# Patient Record
Sex: Female | Born: 1975 | Race: White | Hispanic: No
Health system: Midwestern US, Community
[De-identification: ages and names within clinical notes are randomized; demographics above are authoritative.]

## PROBLEM LIST (undated history)

## (undated) DIAGNOSIS — Z9049 Acquired absence of other specified parts of digestive tract: Secondary | ICD-10-CM

## (undated) DIAGNOSIS — J189 Pneumonia, unspecified organism: Secondary | ICD-10-CM

## (undated) DIAGNOSIS — N809 Endometriosis, unspecified: Secondary | ICD-10-CM

## (undated) DIAGNOSIS — M545 Low back pain: Secondary | ICD-10-CM

## (undated) DIAGNOSIS — Z9889 Other specified postprocedural states: Secondary | ICD-10-CM

## (undated) DIAGNOSIS — R51 Headache: Secondary | ICD-10-CM

## (undated) DIAGNOSIS — Z8719 Personal history of other diseases of the digestive system: Secondary | ICD-10-CM

## (undated) DIAGNOSIS — R112 Nausea with vomiting, unspecified: Secondary | ICD-10-CM

## (undated) DIAGNOSIS — E282 Polycystic ovarian syndrome: Secondary | ICD-10-CM

## (undated) DIAGNOSIS — F908 Attention-deficit hyperactivity disorder, other type: Secondary | ICD-10-CM

## (undated) DIAGNOSIS — E663 Overweight: Secondary | ICD-10-CM

## (undated) DIAGNOSIS — R634 Abnormal weight loss: Secondary | ICD-10-CM

## (undated) DIAGNOSIS — Z20822 Contact with and (suspected) exposure to covid-19: Secondary | ICD-10-CM

## (undated) DIAGNOSIS — R0789 Other chest pain: Secondary | ICD-10-CM

## (undated) HISTORY — PX: CHOLECYSTECTOMY: SHX55

## (undated) HISTORY — DX: Polycystic ovarian syndrome: E28.2

## (undated) HISTORY — DX: Low back pain: M54.5

## (undated) HISTORY — PX: WISDOM TOOTH EXTRACTION: SHX21

## (undated) HISTORY — DX: Acquired absence of other specified parts of digestive tract: Z90.49

## (undated) HISTORY — PX: OOPHORECTOMY: SHX86

---

## 2001-04-09 ENCOUNTER — Other Ambulatory Visit: Admission: RE | Admit: 2001-04-09 | Discharge: 2001-04-09 | Payer: Self-pay | Admitting: Obstetrics and Gynecology

## 2001-09-23 DIAGNOSIS — E282 Polycystic ovarian syndrome: Secondary | ICD-10-CM | POA: Insufficient documentation

## 2002-01-30 ENCOUNTER — Other Ambulatory Visit: Admission: RE | Admit: 2002-01-30 | Discharge: 2002-01-30 | Payer: Self-pay | Admitting: *Deleted

## 2002-05-08 DIAGNOSIS — Z9049 Acquired absence of other specified parts of digestive tract: Secondary | ICD-10-CM

## 2002-05-08 HISTORY — DX: Acquired absence of other specified parts of digestive tract: Z90.49

## 2002-08-26 ENCOUNTER — Inpatient Hospital Stay (HOSPITAL_COMMUNITY): Admission: AD | Admit: 2002-08-26 | Discharge: 2002-08-28 | Payer: Self-pay | Admitting: *Deleted

## 2002-10-02 ENCOUNTER — Other Ambulatory Visit: Admission: RE | Admit: 2002-10-02 | Discharge: 2002-10-02 | Payer: Self-pay | Admitting: *Deleted

## 2002-10-03 ENCOUNTER — Encounter: Payer: Self-pay | Admitting: *Deleted

## 2002-10-03 ENCOUNTER — Encounter: Admission: RE | Admit: 2002-10-03 | Discharge: 2002-10-03 | Payer: Self-pay | Admitting: *Deleted

## 2002-10-16 ENCOUNTER — Encounter: Payer: Self-pay | Admitting: Surgery

## 2002-10-16 ENCOUNTER — Observation Stay (HOSPITAL_COMMUNITY): Admission: RE | Admit: 2002-10-16 | Discharge: 2002-10-16 | Payer: Self-pay | Admitting: Surgery

## 2003-04-15 ENCOUNTER — Encounter: Admission: RE | Admit: 2003-04-15 | Discharge: 2003-04-15 | Payer: Self-pay | Admitting: *Deleted

## 2003-09-21 ENCOUNTER — Other Ambulatory Visit: Admission: RE | Admit: 2003-09-21 | Discharge: 2003-09-21 | Payer: Self-pay | Admitting: *Deleted

## 2012-01-12 ENCOUNTER — Other Ambulatory Visit: Payer: Self-pay | Admitting: Obstetrics and Gynecology

## 2012-01-12 ENCOUNTER — Ambulatory Visit (HOSPITAL_COMMUNITY)
Admission: AD | Admit: 2012-01-12 | Discharge: 2012-01-12 | Disposition: A | Discharge status: Home / Self Care | Payer: Managed Care, Other (non HMO) | Source: Ambulatory Visit | Attending: Obstetrics and Gynecology | Admitting: Obstetrics and Gynecology

## 2012-01-12 ENCOUNTER — Ambulatory Visit (INDEPENDENT_AMBULATORY_CARE_PROVIDER_SITE_OTHER): Payer: Self-pay

## 2012-01-12 ENCOUNTER — Encounter: Payer: Self-pay | Admitting: Obstetrics and Gynecology

## 2012-01-12 ENCOUNTER — Ambulatory Visit (INDEPENDENT_AMBULATORY_CARE_PROVIDER_SITE_OTHER): Payer: Managed Care, Other (non HMO) | Admitting: Obstetrics and Gynecology

## 2012-01-12 ENCOUNTER — Telehealth: Payer: Self-pay | Admitting: Obstetrics and Gynecology

## 2012-01-12 VITALS — BP 118/70 | Temp 98.9°F | Ht 66.0 in | Wt 180.0 lb

## 2012-01-12 DIAGNOSIS — O209 Hemorrhage in early pregnancy, unspecified: Secondary | ICD-10-CM

## 2012-01-12 LAB — CBC
HCT: 40.4 % (ref 36.0–46.0)
Hemoglobin: 14.3 g/dL (ref 12.0–15.0)
MCH: 32.4 pg (ref 26.0–34.0)
MCHC: 35.4 g/dL (ref 30.0–36.0)
MCV: 91.4 fL (ref 78.0–100.0)
Platelets: 356 10*3/uL (ref 150–400)
RBC: 4.42 MIL/uL (ref 3.87–5.11)
RDW: 13.1 % (ref 11.5–15.5)
WBC: 12.2 10*3/uL — ABNORMAL HIGH (ref 4.0–10.5)

## 2012-01-12 LAB — POCT URINALYSIS DIPSTICK
Bilirubin, UA: NEGATIVE
Blood, UA: NEGATIVE
Glucose, UA: NEGATIVE
Leukocytes, UA: NEGATIVE
Nitrite, UA: NEGATIVE
Protein, UA: NEGATIVE
Spec Grav, UA: <=1.005
Urobilinogen, UA: NEGATIVE
pH, UA: 5

## 2012-01-12 LAB — RH TYPE: Rh Type: POSITIVE

## 2012-01-12 LAB — ABO/RH: ABO/RH(D): O POS

## 2012-01-12 LAB — POCT URINE PREGNANCY: Preg Test, Ur: POSITIVE

## 2012-01-12 LAB — ABO

## 2012-01-12 NOTE — Telephone Encounter (Signed)
Triage/ob problem

## 2012-01-12 NOTE — Telephone Encounter (Signed)
Pt called, states is about 7 weeks, for past 2 mornings has had bright red spotting seen with wiping.  Pt also c/o RLQ pain that feels "like something is tearing".  Pt denies any recent IC and is unsure of blood type but this is her 3rd pregnancy.  Pt w/i today w/ AR for eval @ 1200, pt advised to arrive @ 1145.

## 2012-01-12 NOTE — Telephone Encounter (Signed)
knp spoke with pt

## 2012-01-12 NOTE — Progress Notes (Addendum)
Results for orders placed in visit on 01/12/12  POCT URINALYSIS DIPSTICK      Component Value Range   Color, UA yellow     Clarity, UA clr     Glucose, UA neg     Bilirubin, UA neg     Ketones, UA moderate     Spec Grav, UA <=1.005     Blood, UA neg     pH, UA 5.0     Protein, UA neg     Urobilinogen, UA negative     Nitrite, UA neg     Leukocytes, UA Negative    POCT URINE PREGNANCY      Component Value Range   Preg Test, Ur Positive     Reports spotting yest.  No obvious cramping.  No IC since the weekend.  LMP 7/19 with EDD 08/30/12  Filed Vitals:   01/12/12 1226  BP: 118/70  Temp: 98.9 F (37.2 C)   Spec slight spotting from ext cervix which appears friable. VE closed long and firm  A/P Labs - CBC, Blood type, Quant and prog U/S now for viability - + GS with EDD 09/06/12 c/w 6wks, no fetal pole Rec repeat quant in 48hrs and repeat u/s in 10days

## 2012-01-13 LAB — GC/CHLAMYDIA PROBE AMP, GENITAL
Chlamydia, DNA Probe: NEGATIVE
GC Probe Amp, Genital: NEGATIVE

## 2012-01-13 LAB — PROGESTERONE: Progesterone: 16.8 ng/mL

## 2012-01-13 LAB — HCG, QUANTITATIVE, PREGNANCY: hCG, Beta Chain, Quant, S: 11322 m[IU]/mL

## 2012-01-14 ENCOUNTER — Other Ambulatory Visit: Payer: Self-pay | Admitting: Obstetrics and Gynecology

## 2012-01-14 LAB — CULTURE, OB URINE
Colony Count: NO GROWTH
Organism ID, Bacteria: NO GROWTH

## 2012-01-15 LAB — HCG, QUANTITATIVE, PREGNANCY: hCG, Beta Chain, Quant, S: 20075.5 m[IU]/mL

## 2012-01-17 LAB — US OB TRANSVAGINAL

## 2012-01-22 ENCOUNTER — Encounter: Payer: Self-pay | Admitting: Obstetrics and Gynecology

## 2012-01-22 ENCOUNTER — Other Ambulatory Visit: Payer: Managed Care, Other (non HMO)

## 2012-02-19 ENCOUNTER — Encounter (HOSPITAL_COMMUNITY): Payer: Self-pay | Admitting: Pharmacist

## 2012-02-19 NOTE — H&P (Signed)
Sherri Copeland is an 37 y.o. female who had U/S in office on 02/19/12 with IUP, CRL = 10 4/7 weeks, no FCA on prolonged exam. No bleeding, cramping, fever.  Options reviewed.   Pertinent Gynecological History: Menses: pregnant Bleeding: N/A Contraception: none DES exposure: denies Blood transfusions: none Sexually transmitted diseases: no past history Previous GYN Procedures: none  Last mammogram: not done yet Date: N/S Last pap: normal Date: 2013 OB History: G3, P2   Menstrual History: Menarche age: unknown Patient's last menstrual period was 11/24/2011.    No past medical history on file.  No past surgical history on file.  No family history on file.  Social History:  reports that she has never smoked. She does not have any smokeless tobacco history on file. Her alcohol and drug histories not on file.  Allergies: No Known Allergies  No prescriptions prior to admission    Review of Systems  Constitutional: Negative for fever.    Last menstrual period 11/24/2011. Physical Exam  Cardiovascular: Normal rate and regular rhythm.   Respiratory: Effort normal and breath sounds normal.  GI: There is no tenderness.    No results found for this or any previous visit (from the past 24 hour(s)).  No results found.  Assessment/Plan: 36 yo with MAB at 10 4/7 weeks D&E reviewed, risks/benefits discussed O+ Lain Tetterton II,Kayleen Alig E 02/19/2012, 6:31 PM

## 2012-02-20 ENCOUNTER — Encounter (HOSPITAL_COMMUNITY): Payer: Self-pay | Admitting: *Deleted

## 2012-02-20 ENCOUNTER — Encounter (HOSPITAL_COMMUNITY): Payer: Self-pay | Admitting: Anesthesiology

## 2012-02-20 ENCOUNTER — Encounter (HOSPITAL_COMMUNITY)
Admission: AD | Disposition: A | Discharge status: Home / Self Care | Payer: Self-pay | Source: Ambulatory Visit | Attending: Obstetrics and Gynecology

## 2012-02-20 ENCOUNTER — Ambulatory Visit (HOSPITAL_COMMUNITY): Payer: Managed Care, Other (non HMO) | Admitting: Anesthesiology

## 2012-02-20 ENCOUNTER — Ambulatory Visit (HOSPITAL_COMMUNITY)
Admission: AD | Admit: 2012-02-20 | Discharge: 2012-02-20 | Disposition: A | Discharge status: Home / Self Care | Payer: Managed Care, Other (non HMO) | Source: Ambulatory Visit | Attending: Obstetrics and Gynecology | Admitting: Obstetrics and Gynecology

## 2012-02-20 DIAGNOSIS — O021 Missed abortion: Secondary | ICD-10-CM | POA: Insufficient documentation

## 2012-02-20 HISTORY — PX: DILATION AND EVACUATION: SHX1459

## 2012-02-20 HISTORY — DX: Nausea with vomiting, unspecified: R11.2

## 2012-02-20 HISTORY — DX: Nausea with vomiting, unspecified: Z98.890

## 2012-02-20 LAB — CBC
HCT: 43.1 % (ref 36.0–46.0)
Hemoglobin: 14.8 g/dL (ref 12.0–15.0)
MCH: 31.8 pg (ref 26.0–34.0)
MCHC: 34.3 g/dL (ref 30.0–36.0)
MCV: 92.5 fL (ref 78.0–100.0)
Platelets: 323 10*3/uL (ref 150–400)
RBC: 4.66 MIL/uL (ref 3.87–5.11)
RDW: 12.9 % (ref 11.5–15.5)
WBC: 10.8 10*3/uL — ABNORMAL HIGH (ref 4.0–10.5)

## 2012-02-20 SURGERY — DILATION AND EVACUATION, UTERUS
Anesthesia: Monitor Anesthesia Care | Site: Uterus | Wound class: Clean Contaminated

## 2012-02-20 MED ORDER — LIDOCAINE HCL (CARDIAC) 20 MG/ML IV SOLN
INTRAVENOUS | Status: AC
  Filled 2012-02-20: qty 5

## 2012-02-20 MED ORDER — LIDOCAINE HCL (CARDIAC) 20 MG/ML IV SOLN
INTRAVENOUS | Status: DC | PRN
  Administered 2012-02-20: 50 mg via INTRAVENOUS

## 2012-02-20 MED ORDER — SCOPOLAMINE 1 MG/3DAYS TD PT72
1.0000 | MEDICATED_PATCH | Freq: Once | TRANSDERMAL | Status: DC
  Administered 2012-02-20: 1.5 mg via TRANSDERMAL

## 2012-02-20 MED ORDER — MEPERIDINE HCL 25 MG/ML IJ SOLN: 6.2500 mg | INTRAMUSCULAR | Status: DC | PRN

## 2012-02-20 MED ORDER — OXYTOCIN 10 UNIT/ML IJ SOLN
INTRAMUSCULAR | Status: DC | PRN
  Administered 2012-02-20: 20 [IU]

## 2012-02-20 MED ORDER — KETOROLAC TROMETHAMINE 30 MG/ML IJ SOLN
INTRAMUSCULAR | Status: DC | PRN
  Administered 2012-02-20: 30 mg via INTRAVENOUS

## 2012-02-20 MED ORDER — ONDANSETRON HCL 4 MG/2ML IJ SOLN
4.0000 mg | Freq: Once | INTRAMUSCULAR | Status: AC
  Administered 2012-02-20: 4 mg via INTRAVENOUS

## 2012-02-20 MED ORDER — METHYLERGONOVINE MALEATE 0.2 MG/ML IJ SOLN
INTRAMUSCULAR | Status: AC
  Filled 2012-02-20: qty 1

## 2012-02-20 MED ORDER — CEFAZOLIN SODIUM-DEXTROSE 2-3 GM-% IV SOLR
INTRAVENOUS | Status: AC
  Filled 2012-02-20: qty 50

## 2012-02-20 MED ORDER — LIDOCAINE HCL 1 % IJ SOLN
INTRAMUSCULAR | Status: DC | PRN
  Administered 2012-02-20: 20 mL

## 2012-02-20 MED ORDER — FENTANYL CITRATE 0.05 MG/ML IJ SOLN
INTRAMUSCULAR | Status: AC
  Filled 2012-02-20: qty 2

## 2012-02-20 MED ORDER — MIDAZOLAM HCL 5 MG/5ML IJ SOLN
INTRAMUSCULAR | Status: DC | PRN
  Administered 2012-02-20: 2 mg via INTRAVENOUS

## 2012-02-20 MED ORDER — FENTANYL CITRATE 0.05 MG/ML IJ SOLN
INTRAMUSCULAR | Status: DC | PRN
  Administered 2012-02-20 (×4): 50 ug via INTRAVENOUS

## 2012-02-20 MED ORDER — METOCLOPRAMIDE HCL 5 MG/ML IJ SOLN: 10.0000 mg | Freq: Once | INTRAMUSCULAR | Status: DC | PRN

## 2012-02-20 MED ORDER — DEXAMETHASONE SODIUM PHOSPHATE 10 MG/ML IJ SOLN
INTRAMUSCULAR | Status: AC
  Filled 2012-02-20: qty 1

## 2012-02-20 MED ORDER — PROPOFOL 10 MG/ML IV EMUL
INTRAVENOUS | Status: DC | PRN
  Administered 2012-02-20: 40 mg via INTRAVENOUS
  Administered 2012-02-20: 50 mg via INTRAVENOUS
  Administered 2012-02-20 (×2): 20 mg via INTRAVENOUS
  Administered 2012-02-20: 50 mg via INTRAVENOUS
  Administered 2012-02-20: 10 mg via INTRAVENOUS
  Administered 2012-02-20: 50 mg via INTRAVENOUS
  Administered 2012-02-20 (×8): 20 mg via INTRAVENOUS

## 2012-02-20 MED ORDER — DIPHENHYDRAMINE HCL 50 MG/ML IJ SOLN
INTRAMUSCULAR | Status: AC
  Filled 2012-02-20: qty 1

## 2012-02-20 MED ORDER — SCOPOLAMINE 1 MG/3DAYS TD PT72
MEDICATED_PATCH | TRANSDERMAL | Status: AC
  Administered 2012-02-20: 1.5 mg via TRANSDERMAL
  Filled 2012-02-20: qty 1

## 2012-02-20 MED ORDER — METHYLERGONOVINE MALEATE 0.2 MG/ML IJ SOLN
INTRAMUSCULAR | Status: DC | PRN
  Administered 2012-02-20: 0.2 mg via INTRAMUSCULAR

## 2012-02-20 MED ORDER — 0.9 % SODIUM CHLORIDE (POUR BTL) OPTIME
TOPICAL | Status: DC | PRN
  Administered 2012-02-20: 1000 mL

## 2012-02-20 MED ORDER — METOCLOPRAMIDE HCL 5 MG/ML IJ SOLN
INTRAMUSCULAR | Status: DC | PRN
  Administered 2012-02-20: 10 mg via INTRAVENOUS

## 2012-02-20 MED ORDER — METOCLOPRAMIDE HCL 5 MG/ML IJ SOLN
INTRAMUSCULAR | Status: AC
  Filled 2012-02-20: qty 2

## 2012-02-20 MED ORDER — LACTATED RINGERS IV SOLN
INTRAVENOUS | Status: DC
  Administered 2012-02-20 (×2): via INTRAVENOUS

## 2012-02-20 MED ORDER — ONDANSETRON HCL 4 MG/2ML IJ SOLN
INTRAMUSCULAR | Status: AC
  Administered 2012-02-20: 4 mg via INTRAVENOUS
  Filled 2012-02-20: qty 2

## 2012-02-20 MED ORDER — CEFAZOLIN SODIUM-DEXTROSE 2-3 GM-% IV SOLR
2.0000 g | INTRAVENOUS | Status: AC
  Administered 2012-02-20: 2 g via INTRAVENOUS

## 2012-02-20 MED ORDER — DIPHENHYDRAMINE HCL 50 MG/ML IJ SOLN
INTRAMUSCULAR | Status: DC | PRN
  Administered 2012-02-20: 12.5 mg via INTRAVENOUS

## 2012-02-20 MED ORDER — DEXAMETHASONE SODIUM PHOSPHATE 10 MG/ML IJ SOLN
INTRAMUSCULAR | Status: DC | PRN
  Administered 2012-02-20: 10 mg via INTRAVENOUS

## 2012-02-20 MED ORDER — ONDANSETRON HCL 4 MG/2ML IJ SOLN
INTRAMUSCULAR | Status: AC
Start: 2012-02-20 — End: 2012-02-20
  Filled 2012-02-20: qty 2

## 2012-02-20 MED ORDER — ONDANSETRON HCL 4 MG/2ML IJ SOLN
INTRAMUSCULAR | Status: DC | PRN
  Administered 2012-02-20: 4 mg via INTRAVENOUS

## 2012-02-20 MED ORDER — MIDAZOLAM HCL 2 MG/2ML IJ SOLN
INTRAMUSCULAR | Status: AC
Start: 2012-02-20 — End: 2012-02-20
  Filled 2012-02-20: qty 2

## 2012-02-20 MED ORDER — KETOROLAC TROMETHAMINE 30 MG/ML IJ SOLN
INTRAMUSCULAR | Status: AC
  Filled 2012-02-20: qty 1

## 2012-02-20 MED ORDER — FENTANYL CITRATE 0.05 MG/ML IJ SOLN: 25.0000 ug | INTRAMUSCULAR | Status: DC | PRN

## 2012-02-20 MED ORDER — PROPOFOL 10 MG/ML IV EMUL
INTRAVENOUS | Status: AC
  Filled 2012-02-20: qty 20

## 2012-02-20 SURGICAL SUPPLY — 19 items
CATH ROBINSON RED A/P 16FR (CATHETERS) ×2 IMPLANT
CLOTH BEACON ORANGE TIMEOUT ST (SAFETY) ×2 IMPLANT
DECANTER SPIKE VIAL GLASS SM (MISCELLANEOUS) ×2 IMPLANT
DRESSING TELFA 8X3 (GAUZE/BANDAGES/DRESSINGS) ×2 IMPLANT
GLOVE BIO SURGEON STRL SZ8 (GLOVE) ×4 IMPLANT
GOWN STRL REIN XL XLG (GOWN DISPOSABLE) ×4 IMPLANT
KIT BERKELEY 1ST TRIMESTER 3/8 (MISCELLANEOUS) ×2 IMPLANT
NEEDLE SPNL 22GX3.5 QUINCKE BK (NEEDLE) ×2 IMPLANT
NS IRRIG 1000ML POUR BTL (IV SOLUTION) ×2 IMPLANT
PACK VAGINAL MINOR WOMEN LF (CUSTOM PROCEDURE TRAY) ×2 IMPLANT
PAD OB MATERNITY 4.3X12.25 (PERSONAL CARE ITEMS) ×2 IMPLANT
PAD PREP 24X48 CUFFED NSTRL (MISCELLANEOUS) ×2 IMPLANT
SET BERKELEY SUCTION TUBING (SUCTIONS) ×2 IMPLANT
SYR CONTROL 10ML LL (SYRINGE) ×2 IMPLANT
TOWEL OR 17X24 6PK STRL BLUE (TOWEL DISPOSABLE) ×4 IMPLANT
VACURETTE 10 RIGID CVD (CANNULA) ×2 IMPLANT
VACURETTE 7MM CVD STRL WRAP (CANNULA) IMPLANT
VACURETTE 8 RIGID CVD (CANNULA) IMPLANT
VACURETTE 9 RIGID CVD (CANNULA) IMPLANT

## 2012-02-20 NOTE — Anesthesia Postprocedure Evaluation (Signed)
  Anesthesia Post-op Note  Patient: Sherri Copeland  Procedure(s) Performed: Procedure(s) (LRB) with comments: DILATATION AND EVACUATION (N/A) - chromsomes studies  Patient Location: PACU  Anesthesia Type: MAC  Level of Consciousness: awake, alert  and oriented  Airway and Oxygen Therapy: Patient Spontanous Breathing  Post-op Pain: none  Post-op Assessment: Post-op Vital signs reviewed, Patient's Cardiovascular Status Stable, Respiratory Function Stable, Patent Airway and Pain level controlled  Post-op Vital Signs: Reviewed and stable  Complications: No apparent anesthesia complications

## 2012-02-20 NOTE — Transfer of Care (Signed)
Immediate Anesthesia Transfer of Care Note  Patient: Sherri Copeland  Procedure(s) Performed: Procedure(s) (LRB) with comments: DILATATION AND EVACUATION (N/A) - chromsomes studies  Patient Location: PACU  Anesthesia Type: MAC  Level of Consciousness: sedated  Airway & Oxygen Therapy: Patient Spontanous Breathing  Post-op Assessment: Report given to PACU RN  Post vital signs: Reviewed and stable  Complications: No apparent anesthesia complications

## 2012-02-20 NOTE — Progress Notes (Signed)
Reviewed D&E, all questions answered. No changes to H&P per patient history

## 2012-02-20 NOTE — Anesthesia Preprocedure Evaluation (Signed)
Anesthesia Evaluation  Patient identified by MRN, date of birth, ID band Patient awake    Reviewed: Allergy & Precautions, H&P , NPO status , Patient's Chart, lab work & pertinent test results  History of Anesthesia Complications (+) PONV  Airway Mallampati: III TM Distance: >3 FB Neck ROM: Full    Dental No notable dental hx. (+) Teeth Intact   Pulmonary neg pulmonary ROS,  breath sounds clear to auscultation  Pulmonary exam normal       Cardiovascular negative cardio ROS  Rhythm:Regular Rate:Normal     Neuro/Psych negative neurological ROS  negative psych ROS   GI/Hepatic Neg liver ROS, Patient nauseated this am   Endo/Other  negative endocrine ROS  Renal/GU negative Renal ROS  negative genitourinary   Musculoskeletal negative musculoskeletal ROS (+)   Abdominal   Peds  Hematology negative hematology ROS (+)   Anesthesia Other Findings   Reproductive/Obstetrics (+) Pregnancy Missed ab 11weeks                           Anesthesia Physical Anesthesia Plan  ASA: II  Anesthesia Plan: MAC   Post-op Pain Management:    Induction: Intravenous  Airway Management Planned: Natural Airway  Additional Equipment:   Intra-op Plan:   Post-operative Plan: Extubation in OR  Informed Consent: I have reviewed the patients History and Physical, chart, labs and discussed the procedure including the risks, benefits and alternatives for the proposed anesthesia with the patient or authorized representative who has indicated his/her understanding and acceptance.   Dental advisory given  Plan Discussed with: CRNA, Anesthesiologist and Surgeon  Anesthesia Plan Comments:         Anesthesia Quick Evaluation

## 2012-02-20 NOTE — Brief Op Note (Signed)
02/20/2012  1:23 PM  PATIENT:  Sherri Copeland  36 y.o. female  PRE-OPERATIVE DIAGNOSIS:  missed ab  POST-OPERATIVE DIAGNOSIS:  Missed abortion   PROCEDURE:  Procedure(s) (LRB) with comments: DILATATION AND EVACUATION (N/A) - chromsomes studies  SURGEON:  Surgeon(s) and Role:    * Leslie Andrea, MD - Primary  PHYSICIAN ASSISTANT:   ASSISTANTS: none   ANESTHESIA:   MAC  EBL:  Total I/O In: 1300 [I.V.:1300] Out: 150 [Urine:50; Blood:100]  BLOOD ADMINISTERED:none  DRAINS: none   LOCAL MEDICATIONS USED:  LIDOCAINE   SPECIMEN:  Source of Specimen:  POC to path, a portion sent for karyotype  DISPOSITION OF SPECIMEN:  PATHOLOGY  COUNTS:  YES  TOURNIQUET:  * No tourniquets in log *  DICTATION: .Other Dictation: Dictation Number [id number]  PLAN OF CARE: Discharge to home after PACU  PATIENT DISPOSITION:  PACU - hemodynamically stable.   Delay start of Pharmacological VTE agent (>24hrs) due to surgical blood loss or risk of bleeding: not applicable

## 2012-02-21 ENCOUNTER — Encounter (HOSPITAL_COMMUNITY): Payer: Self-pay | Admitting: Obstetrics and Gynecology

## 2012-02-21 NOTE — Op Note (Signed)
NAME:  AIMAN, SONN NO.:  [id number]  MEDICAL RECORD NO.:  [id number]  LOCATION:  [facility]                          FACILITY:  [facility]  PHYSICIAN:  Guy Sandifer. Henderson Cloud, M.D. DATE OF BIRTH:  1976/04/03  DATE OF PROCEDURE:  02/20/2012 DATE OF DISCHARGE:  02/20/2012                              OPERATIVE REPORT   PREOPERATIVE DIAGNOSIS:  Missed abortion.  POSTOPERATIVE DIAGNOSIS:  Missed abortion.  PROCEDURE:  Dilatation and evacuation.  SURGEON:  Guy Sandifer. Henderson Cloud, MD  ANESTHESIA:  MAC, Angelica Pou, MD  ESTIMATED BLOOD LOSS:  Less than 100 mL.  SPECIMENS: 1. Products of conception to Pathology 2. A portion of products of conception for karyotype.  INDICATIONS AND CONSENT:  This patient is a 36 year old G3 P2 at approximately 10-1/2 weeks estimated gestational age, who was noted yesterday on ultrasound in the office to have a crown-rump length consistent with 10-1/2 weeks and no fetal cardiac activity on prolonged examination.  There was no bleeding or cramping.  After discussion of options, the patient requests D and E.  Potential risks and complications were discussed preoperatively including but not limited to, infection, organ damage, uterine perforation, bleeding requiring transfusion of blood products with HIV and hepatitis acquisition, DVT, PE, pneumonia, intrauterine synechia, secondary infertility, need for reoperation, laparotomy, laparoscopy.  All questions had been answered. Consent was signed on the chart.  After discussion of options, the patient also requests karyotyping of the tissue.  PROCEDURE:  The patient was taken to the operating room where she was identified, placed in dorsal supine position, and given intravenous sedation.  She was placed in a dorsal lithotomy position.  Time-out undertaken.  She was gently prepped, bladder straight catheterized, and draped in a sterile fashion.  Bivalve speculum was placed in the vagina. Anterior  cervical lip was injected with 1% Xylocaine and grasped with a single-tooth tenaculum.  Paracervical block was placed at 2, 4, 5, 7, 8, and 10 o'clock positions with approximately 20 mL of the same solution. Cervix was gently progressively dilated to a 33 dilator.  A #10 curved suction curette was then passed and suction curettage was carried out for obvious products of conception.  Pitocin 20 units were added to liter of IV fluids at this point.  Alternating suction and sharp curettage was carried out and the cavity was clean.  The patient was given Methergine 0.2 mg IM at the end of the case.  She did receive antibiotics.  Blood type is O positive.  All counts correct.  The patient was awakened and taken to recovery room in stable condition.     Guy Sandifer Henderson Cloud, M.D.     JET/MEDQ  D:  02/20/2012  T:  02/21/2012  Job:  [id number]

## 2012-03-06 ENCOUNTER — Ambulatory Visit: Payer: Self-pay | Admitting: Obstetrics and Gynecology

## 2012-03-08 LAB — CHROMOSOME STD, POC(TISSUE)-NCBH

## 2012-05-08 DIAGNOSIS — M545 Low back pain, unspecified: Secondary | ICD-10-CM

## 2012-05-08 HISTORY — DX: Low back pain, unspecified: M54.50

## 2012-09-20 ENCOUNTER — Ambulatory Visit (INDEPENDENT_AMBULATORY_CARE_PROVIDER_SITE_OTHER): Payer: Managed Care, Other (non HMO) | Admitting: Family Medicine

## 2012-09-20 ENCOUNTER — Encounter: Payer: Self-pay | Admitting: Family Medicine

## 2012-09-20 VITALS — BP 125/84 | HR 81 | Temp 98.2°F | Resp 14 | Ht 66.25 in | Wt 179.0 lb

## 2012-09-20 DIAGNOSIS — Z7189 Other specified counseling: Secondary | ICD-10-CM

## 2012-09-20 DIAGNOSIS — Z7689 Persons encountering health services in other specified circumstances: Secondary | ICD-10-CM | POA: Insufficient documentation

## 2012-09-20 NOTE — Assessment & Plan Note (Signed)
Reviewed history. Recommended she take 1500mg  calcium daily and 800 IU vit D daily. Encouraged pt to make appt for full CPE with fasting labs at her convenience. She'll be going to see endocrinologist to discuss PCOS dx by her GYN soon.

## 2012-09-20 NOTE — Progress Notes (Signed)
Office Note 09/20/2012  CC:  Chief Complaint  Patient presents with  . Establish Care    NP to establish.    HPI:  Sherri Copeland is a 37 y.o. White female who is here to establish care. Patient's most recent primary MD: none (has GYN she sees annually).  Last pap 01/2012--no hx of abnormal pap. Last Tdap 2009. Old records were not reviewed prior to or during today's visit.  She has no acute complaints or questions for me today.  Past Medical History  Diagnosis Date  . PONV (postoperative nausea and vomiting)   . Hx of cholecystectomy 2004  . PCOS (polycystic ovarian syndrome)     Pt set up to see Dr. Elvera Lennox in May 2014 (referred by her GYN)    Past Surgical History  Procedure Laterality Date  . Cholecystectomy    . Diagnostic laparoscopy    . Dilation and evacuation  02/20/2012    Procedure: DILATATION AND EVACUATION;  Surgeon: Leslie Andrea, MD;  Location: [facility];  Service: Gynecology;  Laterality: N/A;  chromsomes studies  . Wisdom tooth extraction      Family History  Problem Relation Age of Onset  . Hyperlipidemia Other     All Grandparents  . Hypertension Other     All Grandparents  . Lung cancer Paternal Grandfather   . Heart disease Paternal Grandmother   . Healthy Mother   . Healthy Father     History   Social History  . Marital Status: Married    Spouse Name: N/A    Number of Children: N/A  . Years of Education: N/A   Occupational History  . Not on file.   Social History Main Topics  . Smoking status: Never Smoker   . Smokeless tobacco: Never Used  . Alcohol Use: No  . Drug Use: No  . Sexually Active: Yes   Other Topics Concern  . Not on file   Social History Narrative   Married, two sons.   Orig from [place] area.   Occupation: Print production planner for [employer].   College: App   No tob.  Occ social alcohol.  No drugs.   Exercise: somewhat inconsistent (home workout videos).         MEDS: none  No Known  Allergies  ROS Review of Systems  Constitutional: Negative for fever and fatigue.  HENT: Negative for congestion and sore throat.   Eyes: Negative for visual disturbance.  Respiratory: Negative for cough.   Cardiovascular: Negative for chest pain.  Gastrointestinal: Negative for nausea and abdominal pain.  Genitourinary: Negative for dysuria.  Musculoskeletal: Negative for back pain and joint swelling.  Skin: Negative for rash.  Neurological: Negative for weakness and headaches.  Hematological: Negative for adenopathy.    PE; Blood pressure 125/84, pulse 81, temperature 98.2 F (36.8 C), temperature source Oral, resp. rate 14, height 5' 6.25" (1.683 m), weight 179 lb (81.194 kg), last menstrual period 08/24/2012, SpO2 100.00%, unknown if currently breastfeeding. Gen: Alert, well appearing.  Patient is oriented to person, place, time, and situation. AFFECT: pleasant, lucid thought and speech. ENT:  Eyes: no injection, icteris, swelling, or exudate.  EOMI, PERRLA. Nose: no drainage or turbinate edema/swelling.  No injection or focal lesion.  Mouth: lips without lesion/swelling.  Oral mucosa pink and moist.  Dentition intact and without obvious caries or gingival swelling.  Oropharynx without erythema, exudate, or swelling.  CV: RRR, no m/r/g.   LUNGS: CTA bilat, nonlabored resps, good aeration in all  lung fields. EXT: no clubbing, cyanosis, or edema.   Pertinent labs:  None today  ASSESSMENT AND PLAN:    New Pt.  Encounter to establish care with new doctor Reviewed history. Recommended she take 1500mg  calcium daily and 800 IU vit D daily. Encouraged pt to make appt for full CPE with fasting labs at her convenience. She'll be going to see endocrinologist to discuss PCOS dx by her GYN soon.  An After Visit Summary was printed and given to the patient.  Return for Return at your convenience for CPE with fasting lab work.

## 2012-09-20 NOTE — Patient Instructions (Addendum)
Calcium 1500 mg per day + Vit 800 IU per day

## 2012-09-23 ENCOUNTER — Encounter: Payer: Self-pay | Admitting: Internal Medicine

## 2012-09-23 ENCOUNTER — Ambulatory Visit (INDEPENDENT_AMBULATORY_CARE_PROVIDER_SITE_OTHER): Payer: Managed Care, Other (non HMO) | Admitting: Internal Medicine

## 2012-09-23 VITALS — BP 104/68 | HR 78 | Temp 98.2°F | Resp 10 | Ht 66.0 in | Wt 181.0 lb

## 2012-09-23 DIAGNOSIS — E282 Polycystic ovarian syndrome: Secondary | ICD-10-CM

## 2012-09-23 LAB — COMPREHENSIVE METABOLIC PANEL
AST: 15 U/L (ref 0–37)
Albumin: 4.2 g/dL (ref 3.5–5.2)
BUN: 13 mg/dL (ref 6–23)
Calcium: 9.4 mg/dL (ref 8.4–10.5)
Chloride: 106 mEq/L (ref 96–112)
Glucose, Bld: 68 mg/dL — ABNORMAL LOW (ref 70–99)
Potassium: 3.4 mEq/L — ABNORMAL LOW (ref 3.5–5.1)
Sodium: 140 mEq/L (ref 135–145)
Total Protein: 8 g/dL (ref 6.0–8.3)

## 2012-09-23 LAB — FOLLICLE STIMULATING HORMONE: FSH: 4.3 m[IU]/mL

## 2012-09-23 NOTE — Progress Notes (Signed)
Subjective:     Patient ID: Sherri Copeland, female   DOB: January 17, 1976, 37 y.o.   MRN: [id number]  HPI Sherri Copeland is a pleasant 37 year old woman, referred by Dr. Milinda Cave, for management of PCOS.  She was dx w/ PCOS in 2003, when trying to get pregnant with her first child - off OCP for few years before this w/o getting pregnant. She started Clomid to get pregnant, and this was successful. She did have increased BP at the end of pregnancy, resolved by induction. For the second child, she tried Clomid again, it 3 times higher does, without results. She then started Metfromin and this was successful, and she continued up to 20 weeks in her pregnancy. After her second pregnancy, she was started again on OCP, then NuvaRing (1-2 years), then Estrogen patch, then stopped. She was getting headaches while on contraceptives, so she had to stop them. She did not use protection and that time, and she got pregnant for the third time, but miscarried at 10 weeks. Dinner husband decided not to have children anymore, and he had a vasectomy. They also use condoms. The patient describes that she now has a cycle every month, however, the bleeding is not predictable and last month bled for 12 days.   Patient also complains of hirsutism. She has had hair on her face, however at this got worse on chin and neck, and also appeared on the medial side of her upper legs. She has acne, but this has improved after starting using Aveda products. she also has problems with her weight, but she was able to use 20 lbs last year with help of weight watchers. She gained 10 lbs back. She improved her diet and exercise - workout videos at home.  She has a family history of infertility he her mother, who used Clomid to get pregnant. Sister does not have PCOS.   She tells me that her PCOS condition was diagnosed by OB/GYN, but she did not have recent labs. At that time, she remembers that her testosterone was high, but does not  Review of  Systems Constitutional: + weight gain and loss, no fatigue, no subjective hyperthermia/hypothermia Eyes: no blurry vision, no xerophthalmia ENT: no sore throat, no nodules palpated in throat, no dysphagia/odynophagia, no hoarseness: + tinnitus Cardiovascular: no CP/SOB/palpitations/leg swelling Respiratory: no cough/SOB Gastrointestinal: no N/V/D/C Musculoskeletal: no muscle/joint aches Skin: + mild acne, + excessive hair growth Neurological: no tremors/numbness/tingling/dizziness, + occasional headache Psychiatric: no depression/anxiety  Past Medical History  Diagnosis Date  . PONV (postoperative nausea and vomiting)   . Hx of cholecystectomy 2004  . PCOS (polycystic ovarian syndrome)     Pt set up to see Dr. Elvera Lennox in May 2014 (referred by her GYN)   Past Surgical History  Procedure Laterality Date  . Cholecystectomy    . Diagnostic laparoscopy    . Dilation and evacuation  02/20/2012    Procedure: DILATATION AND EVACUATION;  Surgeon: Leslie Andrea, MD;  Location: [facility];  Service: Gynecology;  Laterality: N/A;  chromsomes studies  . Wisdom tooth extraction     History   Social History  . Marital Status: Married    Spouse Name: N/A    Number of Children: 2: 1 and 4 y/o   Occupational History  . Human resources officer   Social History Main Topics  . Smoking status: Never Smoker   . Smokeless tobacco: Never Used  . Alcohol Use: No  . Drug Use: No  . Sexually Active:  Yes   Social History Narrative   Married, two sons.   Orig from [place] area.   Occupation: Print production planner for [employer].   College: [employer]   No tob.  Occ social alcohol.  No drugs.   Exercise: somewhat inconsistent (home workout videos).   No current outpatient prescriptions on file prior to visit.   No current facility-administered medications on file prior to visit.   No Known Allergies  Family History  Problem Relation Age of Onset  . Hyperlipidemia Other     All Grandparents  . Hypertension  Other     All Grandparents  . Lung cancer Paternal Grandfather   . Heart disease Paternal Grandmother   . Healthy Mother   . Healthy Father    Objective:   Physical Exam BP 104/68  Pulse 78  Temp(Src) 98.2 F (36.8 C) (Oral)  Resp 10  Ht 5\' 6"  (1.676 m)  Wt 181 lb (82.101 kg)  BMI 29.23 kg/m2  SpO2 96%  LMP 08/24/2012  Breastfeeding? No Constitutional: overweight, in NAD Eyes: PERRLA, EOMI, no exophthalmos ENT: moist mucous membranes, no thyromegaly, no cervical lymphadenopathy Cardiovascular: RRR, No MRG Respiratory: CTA B Gastrointestinal: abdomen soft, NT, ND, BS+ Musculoskeletal: no deformities, strength intact in all 4 Skin: moist, warm, no rashes - acne marks on face, vellum on sideburns, chin hair plucked so not visible Neurological: no tremor with outstretched hands, DTR normal in all 4  Assessment:     1. PCOS  - labs unavailable - Menstrual cycles monthly - hirsutism on chin and neck    Plan:     We discussed about her diagnosis of PCOS, and the fact that there is no specific treatment for this condition, so we usually  address different components individually.  Since she does not have amenorrhea, and also with her previous history of headaches with oral contraceptives, I do not believe that oral contraceptives are indicated. Contraception is achieved now with vasectomy and condoms.  She does not want to get pregnant, however if she did, a referral to a reproductive endocrinology would have been the next step  Her acne is not bothering her right now, and the Aveda cosmetic products are helping  She is bothered by her hirsutism, we discussed different options for this. Oral contraceptives are definitely helping and they are first line, however say she is now tolerating them, we can use spironolactone or Eflornitine cream. I explained the advantages and disadvantages of both and we decided to start the spironolactone - I would first like to obtain several  tests to rule in PCOS and to rule out other conditions like: hypothyroidism, hyperprolactinemia, CAH, prediabetes  - we'll obtain labs today and will start spironolactone after we obtain the results back. - I will send her the results to my chart - If we end up starting spironolactone, I will have her back in 2-3 weeks to check her blood pressure and her potassium. I will obtain a potassium level today, too - we had a long discussion about improving her diet to help her with weight loss, and I suggested a plant-based diet. We discussed about different diets and also the benefits of this diet.   Office Visit on 09/23/2012  Component Date Value Range Status  . TSH 09/23/2012 0.39  0.35 - 5.50 uIU/mL Final  . Free T4 09/23/2012 0.86  0.60 - 1.60 ng/dL Final  . T3, Free 16/02/9603 3.9  2.3 - 4.2 pg/mL Final  . Prolactin 09/23/2012 7.9   Final  Comment:      Reference Ranges:                                           Female:                       2.1 -  17.1 ng/ml                                           Female:   Pregnant          9.7 - 208.5 ng/mL                                                     Non Pregnant      2.8 -  29.2 ng/mL                                                     Post Menopausal   1.8 -  20.3 ng/mL                                              . LH 09/23/2012 3.25   Final   Comment: Female Reference Range:20-70 yrs     1.5-9.3 mIU/mL>70 yrs       3.1-35.6 mIU/mLFemale Reference Range:Follicular Phase     1.9-12.5 mIU/mLMidcycle             8.7-76.3 mIU/mLLuteal Phase         0.5-16.9 mIU/mL  Post Menopausal      15.9-54.0                           mIU/mLPregnant             <1.5 mIU/mLContraceptives       0.7-5.6 mIU/mL  . [facility] 09/23/2012 4.3   Final   Female Reference Range:  1.4-18.1 mIU/mLFemale Reference Range:Follicular Phase          2.5-10.2 mIU/mLMidCycle Peak          3.4-33.4 mIU/mLLuteal Phase          1.5-9.1 mIU/mLPost Menopausal     23.0-116.3 mIU/mLPregnant           <0.3 mIU/mL  . Testosterone 09/23/2012 43  10 - 70 ng/dL Final   Comment:           Tanner Stage       Female              Female                                        I              <  30 ng/dL        < 10 ng/dL                                        II             < 150 ng/dL       < 30 ng/dL                                        III            100-320 ng/dL     < 35 ng/dL                                        IV             200-970 ng/dL     16-10 ng/dL                                        V/Adult        300-890 ng/dL     96-04 ng/dL                             . Sex Hormone Binding 09/23/2012 60  18 - 114 nmol/L Final  . Testosterone, Free 09/23/2012 5.2  0.6 - 6.8 pg/mL Final   Comment:                            The concentration of free testosterone is derived from a mathematical                          expression based on constants for the binding of testosterone to sex                          hormone-binding globulin and albumin.  . Testosterone-% Freee. 09/23/2012 1.2  0.4 - 2.4 % Final  . Estradiol, Free 09/23/2012 2.42   Final   Comment: FEMALE REFERENCE RANGES FOR ESTRADIOL, FREE:                            Follicular Stage:  0.43-5.03 pg/mL                            Mid-cycle Stage:   0.72-5.89 pg/mL                            Luteal Stage:      0.40-5.55 pg/mL                            Postmenopausal:    < or = 0.38 pg/mL  . Estradiol 09/23/2012 118   Final   Comment: FEMALE REFERENCE RANGES FOR ESTRADIOL:  Follicular Stage:  39-375 pg/mL                            Mid-cycle Stage:   94-762 pg/mL                            Luteal Stage:      48-440 pg/mL                            Postmenopausal:    < or = 10 pg/mL  . Results Received 09/23/2012 10/07/12   Final   Comment: Reference lab accession: [id number]                          Test performed by:                                     [facility]                                      [address] [place], [place] [address]                                     Phone:  [phone]                          Director:  Pat Patrick, M.D.  Marland Kitchen 17-OH-Progesterone, LC/Sherri/Sherri 09/23/2012 118   Final   Comment:                            Adult Female Reference Ranges for                            17-Hydroxyprogesterone, LC/Sherri/Sherri:                                                        Follicular Phase:       < or = 185 ng/dL                             Luteal Phase:           < or = 285 ng/dL                             Postmenopausal Phase:   < or = 45 ng/dL                             Pregnancy:  First Trimester:  78-457 ng/dL                             Second Trimester: 90-357 ng/dL                             Third Trimester:  144-578 ng/dL  . Androstenedione 09/23/2012 108   Final   Comment:                            Adult Female Reference Ranges for                            Androstenedione, Serum:                                                        Follicular Phase:      35-250 ng/dL                             Luteal Phase:          30-235 ng/dL                             Postmenopausal Phase:  20-75  ng/dL  . Hemoglobin A1C 09/23/2012 5.1  4.6 - 6.5 % Final   Glycemic Control Guidelines for People with Diabetes:Non Diabetic:  <6%Goal of Therapy: <7%Additional Action Suggested:  >8%   . Sodium 09/23/2012 140  135 - 145 mEq/L Final  . Potassium 09/23/2012 3.4* 3.5 - 5.1 mEq/L Final  . Chloride 09/23/2012 106  96 - 112 mEq/L Final  . CO2 09/23/2012 24  19 - 32 mEq/L Final  . Glucose, Bld 09/23/2012 68* 70 - 99 mg/dL Final  . BUN 13/12/6576 13  6 - 23 mg/dL Final  . Creatinine, Ser 09/23/2012 1.0  0.4 - 1.2 mg/dL Final  . Total Bilirubin 09/23/2012 1.2  0.3 - 1.2 mg/dL Final  . Alkaline Phosphatase 09/23/2012 54  39 - 117 U/L Final  . AST 09/23/2012 15  0 - 37 U/L Final  .  ALT 09/23/2012 15  0 - 35 U/L Final  . Total Protein 09/23/2012 8.0  6.0 - 8.3 g/dL Final  . Albumin 46/96/2952 4.2  3.5 - 5.2 g/dL Final  . Calcium 84/13/2440 9.4  8.4 - 10.5 mg/dL Final  . GFR 03/04/2535 67.52  >60.00 mL/min Final   Msg set to pt:  Dear Sherri Copeland,  All labs are back now. The testosterone is on the high side but not above normal. However, due to your clinical signs of hyperandrogenism (acne, hair on face), I think it's ok to start spironolactone. I will send this to your pharmacy. Please take 50 mg 2x a day. Please use good contraception with this as it can cause malformations in the baby if you get pregnant! Also, I would like you to come back for a nurse visit for blood pressure and a potassium level in 2 weeks after you start the medication.  Please let me know if you have any questions.  Sincerely,  Carlus Pavlov  MD

## 2012-09-23 NOTE — Patient Instructions (Addendum)
Please return in ~3 weeks for a blood pressure and potassium check - after starting Spironolactone. Please make sure you use proper contraception if we decide to start Spironolactone.

## 2012-09-24 LAB — TESTOSTERONE, FREE, TOTAL, SHBG
Sex Hormone Binding: 60 nmol/L (ref 18–114)
Testosterone-% Free: 1.2 % (ref 0.4–2.4)
Testosterone: 43 ng/dL (ref 10–70)

## 2012-09-27 LAB — ANDROSTENEDIONE: Androstenedione: 108 ng/dL

## 2012-10-01 ENCOUNTER — Encounter: Payer: Self-pay | Admitting: Family Medicine

## 2012-10-01 ENCOUNTER — Ambulatory Visit (INDEPENDENT_AMBULATORY_CARE_PROVIDER_SITE_OTHER): Payer: Managed Care, Other (non HMO) | Admitting: Family Medicine

## 2012-10-01 VITALS — BP 123/77 | HR 62 | Temp 98.0°F | Resp 14 | Ht 66.5 in | Wt 182.5 lb

## 2012-10-01 DIAGNOSIS — Z0000 Encounter for general adult medical examination without abnormal findings: Secondary | ICD-10-CM

## 2012-10-01 LAB — CBC WITH DIFFERENTIAL/PLATELET
Basophils Absolute: 0 10*3/uL (ref 0.0–0.1)
Basophils Relative: 0.7 % (ref 0.0–3.0)
Eosinophils Absolute: 0.1 10*3/uL (ref 0.0–0.7)
Lymphocytes Relative: 35.9 % (ref 12.0–46.0)
MCHC: 33.7 g/dL (ref 30.0–36.0)
MCV: 95 fl (ref 78.0–100.0)
Monocytes Absolute: 0.5 10*3/uL (ref 0.1–1.0)
Neutrophils Relative %: 54.7 % (ref 43.0–77.0)
Platelets: 284 10*3/uL (ref 150.0–400.0)
RBC: 4.16 Mil/uL (ref 3.87–5.11)

## 2012-10-01 LAB — LIPID PANEL
Cholesterol: 148 mg/dL (ref 0–200)
HDL: 49.5 mg/dL (ref >39.00)
Triglycerides: 57 mg/dL (ref 0.0–149.0)

## 2012-10-01 NOTE — Assessment & Plan Note (Signed)
Reviewed age and gender appropriate health maintenance issues (prudent diet, regular exercise, health risks of tobacco and excessive alcohol, use of seatbelts, fire alarms in home, use of sunscreen).  Also reviewed age and gender appropriate health screening as well as vaccine recommendations. CBC and FLP today.  Vaccines UTD. GYN exam UTD--she sees GYN for this. She'll continue w/u with Dr. Elvera Lennox regarding her PCOS.

## 2012-10-01 NOTE — Progress Notes (Signed)
Office Note 10/01/2012  CC:  Chief Complaint  Patient presents with  . Annual Exam    Pt fasting for labs.    HPI:  Sherri Copeland is a 37 y.o. White female who is here for CPE. No acute complaints. Saw Dr. Elvera Lennox 09/23/12 and got many labs done to further eval PCOS.  Plans on starting aldactone after all labs have been resulted and discussed. FLP and CBC were the only health maintenance labs not done--to be done today.   Past Medical History  Diagnosis Date  . PONV (postoperative nausea and vomiting)   . Hx of cholecystectomy 2004  . PCOS (polycystic ovarian syndrome)     Pt set up to see Dr. Elvera Lennox in May 2014 (referred by her GYN)    Past Surgical History  Procedure Laterality Date  . Cholecystectomy    . Diagnostic laparoscopy    . Dilation and evacuation  02/20/2012    Procedure: DILATATION AND EVACUATION;  Surgeon: Leslie Andrea, MD;  Location: [facility];  Service: Gynecology;  Laterality: N/A;  chromsomes studies  . Wisdom tooth extraction      Family History  Problem Relation Age of Onset  . Hyperlipidemia Other     All Grandparents  . Hypertension Other     All Grandparents  . Lung cancer Paternal Grandfather   . Heart disease Paternal Grandmother   . Healthy Mother   . Healthy Father     History   Social History  . Marital Status: Married    Spouse Name: N/A    Number of Children: N/A  . Years of Education: N/A   Occupational History  . Not on file.   Social History Main Topics  . Smoking status: Never Smoker   . Smokeless tobacco: Never Used  . Alcohol Use: No  . Drug Use: No  . Sexually Active: Yes   Other Topics Concern  . Not on file   Social History Narrative   Married, two sons.   Orig from [place] area.   Occupation: Print production planner for [employer].   College: [employer]   No tob.  Occ social alcohol.  No drugs.   Exercise: somewhat inconsistent (home workout videos).          No outpatient prescriptions prior to visit.   No  facility-administered medications prior to visit.    No Known Allergies  ROS Review of Systems  Constitutional: Negative for fever, chills, appetite change and fatigue.  HENT: Negative for ear pain, congestion, sore throat, neck stiffness and dental problem.   Eyes: Negative for discharge, redness and visual disturbance.  Respiratory: Negative for cough, chest tightness, shortness of breath and wheezing.   Cardiovascular: Negative for chest pain, palpitations and leg swelling.  Gastrointestinal: Negative for nausea, vomiting, abdominal pain, diarrhea and blood in stool.  Genitourinary: Negative for dysuria, urgency, frequency, hematuria, flank pain and difficulty urinating.  Musculoskeletal: Negative for myalgias, back pain, joint swelling and arthralgias.  Skin: Negative for pallor and rash.  Neurological: Negative for dizziness, speech difficulty, weakness and headaches.  Hematological: Negative for adenopathy. Does not bruise/bleed easily.  Psychiatric/Behavioral: Negative for confusion and sleep disturbance. The patient is not nervous/anxious.     PE; Blood pressure 123/77, pulse 62, temperature 98 F (36.7 C), temperature source Oral, resp. rate 14, height 5' 6.5" (1.689 m), weight 182 lb 8 oz (82.781 kg), last menstrual period 09/18/2012, SpO2 100.00%. Gen: Alert, well appearing.  Patient is oriented to person, place, time, and situation.  AFFECT: pleasant, lucid thought and speech. ENT: Ears: EACs clear, normal epithelium.  TMs with good light reflex and landmarks bilaterally.  Eyes: no injection, icteris, swelling, or exudate.  EOMI, PERRLA. Nose: no drainage or turbinate edema/swelling.  No injection or focal lesion.  Mouth: lips without lesion/swelling.  Oral mucosa pink and moist.  Dentition intact and without obvious caries or gingival swelling.  Oropharynx without erythema, exudate, or swelling.  Neck: supple/nontender.  No LAD, mass, or TM.  Carotid pulses 2+ bilaterally,  without bruits. CV: RRR, no m/r/g.   LUNGS: CTA bilat, nonlabored resps, good aeration in all lung fields. ABD: soft, NT, ND, BS normal.  No hepatospenomegaly or mass.  No bruits. EXT: no clubbing, cyanosis, or edema.  Musculoskeletal: no joint swelling, erythema, warmth, or tenderness.  ROM of all joints intact. Skin - no sores or suspicious lesions or rashes or color changes Neuro: CN 2-12 intact bilaterally, strength 5/5 in proximal and distal upper extremities and lower extremities bilaterally.  No sensory deficits.  No tremor.  No disdiadochokinesis.  No ataxia.  Upper extremity and lower extremity DTRs symmetric.  No pronator drift.  Pertinent labs:    Chemistry      Component Value Date/Time   NA 140 09/23/2012 0857   K 3.4* 09/23/2012 0857   CL 106 09/23/2012 0857   CO2 24 09/23/2012 0857   BUN 13 09/23/2012 0857   CREATININE 1.0 09/23/2012 0857      Component Value Date/Time   CALCIUM 9.4 09/23/2012 0857   ALKPHOS 54 09/23/2012 0857   AST 15 09/23/2012 0857   ALT 15 09/23/2012 0857   BILITOT 1.2 09/23/2012 0857     Lab Results  Component Value Date   TSH 0.39 09/23/2012     ASSESSMENT AND PLAN:   No problem-specific assessment & plan notes found for this encounter.   FOLLOW UP:  No Follow-up on file.

## 2012-10-01 NOTE — Patient Instructions (Addendum)

## 2012-10-07 LAB — ESTRADIOL, FREE: Estradiol, Free: 2.42 pg/mL

## 2012-10-08 ENCOUNTER — Encounter: Payer: Self-pay | Admitting: Internal Medicine

## 2012-10-08 ENCOUNTER — Encounter: Payer: Self-pay | Admitting: Family Medicine

## 2012-10-08 MED ORDER — SPIRONOLACTONE 50 MG PO TABS: 50.0000 mg | ORAL_TABLET | Freq: Two times a day (BID) | ORAL | Status: DC

## 2012-10-14 ENCOUNTER — Ambulatory Visit: Payer: Managed Care, Other (non HMO) | Admitting: Internal Medicine

## 2012-11-05 ENCOUNTER — Telehealth: Payer: Self-pay | Admitting: *Deleted

## 2012-11-05 NOTE — Telephone Encounter (Signed)
Can you call pt and schedule a nurse visit for pt to come in for a BP check and lab draw for potassium level. Thank you.

## 2012-11-05 NOTE — Telephone Encounter (Signed)
Yes, no problem!

## 2012-11-05 NOTE — Telephone Encounter (Signed)
Pt called and lvm stating that she needs to come in for a BP check and to have her potassium checked. Is this ok to schedule?

## 2012-11-07 NOTE — Telephone Encounter (Signed)
Can you call pt and schedule a nurse visit for a BP check and a lab visit for a blood draw for potassium level. Thank you.

## 2012-11-12 ENCOUNTER — Encounter: Payer: Self-pay | Admitting: Internal Medicine

## 2012-11-12 ENCOUNTER — Telehealth: Payer: Self-pay | Admitting: *Deleted

## 2012-11-12 DIAGNOSIS — E282 Polycystic ovarian syndrome: Secondary | ICD-10-CM

## 2012-11-12 NOTE — Telephone Encounter (Signed)
Called pt and worked it out for her to come in on Thursday morning to do a blood pressure check. Please put order in Epic for blood draw to check her potassium. Thank you.

## 2012-11-12 NOTE — Telephone Encounter (Signed)
LM for pt to call and schedule an appt for nurse visit and Lab work.  [place]

## 2012-11-14 ENCOUNTER — Other Ambulatory Visit (INDEPENDENT_AMBULATORY_CARE_PROVIDER_SITE_OTHER): Payer: Managed Care, Other (non HMO)

## 2012-11-14 ENCOUNTER — Ambulatory Visit: Payer: Managed Care, Other (non HMO)

## 2012-11-14 DIAGNOSIS — E282 Polycystic ovarian syndrome: Secondary | ICD-10-CM

## 2012-11-14 LAB — BASIC METABOLIC PANEL
BUN: 15 mg/dL (ref 6–23)
CO2: 30 mEq/L (ref 19–32)
Calcium: 9.6 mg/dL (ref 8.4–10.5)
Chloride: 98 mEq/L (ref 96–112)
Creatinine, Ser: 1 mg/dL (ref 0.4–1.2)
Glucose, Bld: 92 mg/dL (ref 70–99)

## 2012-11-25 ENCOUNTER — Other Ambulatory Visit: Payer: Self-pay | Admitting: *Deleted

## 2012-11-25 MED ORDER — SPIRONOLACTONE 50 MG PO TABS: 50.0000 mg | ORAL_TABLET | Freq: Two times a day (BID) | ORAL | Status: DC

## 2012-11-25 NOTE — Telephone Encounter (Signed)
Rx request to pharmacy/[facility]  

## 2013-01-06 HISTORY — PX: DIAGNOSTIC LAPAROSCOPY: SUR761

## 2013-01-27 ENCOUNTER — Other Ambulatory Visit: Payer: Self-pay | Admitting: Obstetrics and Gynecology

## 2013-02-27 ENCOUNTER — Other Ambulatory Visit: Payer: Self-pay | Admitting: Dermatology

## 2013-03-13 ENCOUNTER — Other Ambulatory Visit: Payer: Self-pay

## 2013-03-15 ENCOUNTER — Emergency Department (HOSPITAL_BASED_OUTPATIENT_CLINIC_OR_DEPARTMENT_OTHER)
Admission: EM | Admit: 2013-03-15 | Discharge: 2013-03-15 | Disposition: A | Discharge status: Home / Self Care | Payer: Managed Care, Other (non HMO) | Attending: Emergency Medicine | Admitting: Emergency Medicine

## 2013-03-15 ENCOUNTER — Encounter (HOSPITAL_BASED_OUTPATIENT_CLINIC_OR_DEPARTMENT_OTHER): Payer: Self-pay | Admitting: Emergency Medicine

## 2013-03-15 DIAGNOSIS — Z79899 Other long term (current) drug therapy: Secondary | ICD-10-CM | POA: Insufficient documentation

## 2013-03-15 DIAGNOSIS — M545 Low back pain, unspecified: Secondary | ICD-10-CM | POA: Insufficient documentation

## 2013-03-15 DIAGNOSIS — Z862 Personal history of diseases of the blood and blood-forming organs and certain disorders involving the immune mechanism: Secondary | ICD-10-CM | POA: Insufficient documentation

## 2013-03-15 DIAGNOSIS — Z8639 Personal history of other endocrine, nutritional and metabolic disease: Secondary | ICD-10-CM | POA: Insufficient documentation

## 2013-03-15 DIAGNOSIS — M79609 Pain in unspecified limb: Secondary | ICD-10-CM | POA: Insufficient documentation

## 2013-03-15 DIAGNOSIS — Z8742 Personal history of other diseases of the female genital tract: Secondary | ICD-10-CM | POA: Insufficient documentation

## 2013-03-15 HISTORY — DX: Endometriosis, unspecified: N80.9

## 2013-03-15 MED ORDER — PREDNISONE 50 MG PO TABS: 50.0000 mg | ORAL_TABLET | Freq: Every day | ORAL | Status: DC

## 2013-03-15 MED ORDER — DIAZEPAM 2 MG PO TABS
2.0000 mg | ORAL_TABLET | Freq: Once | ORAL | Status: AC
  Administered 2013-03-15: 2 mg via ORAL
  Filled 2013-03-15: qty 1

## 2013-03-15 MED ORDER — METHOCARBAMOL 500 MG PO TABS: 500.0000 mg | ORAL_TABLET | Freq: Two times a day (BID) | ORAL | Status: DC

## 2013-03-15 MED ORDER — ONDANSETRON 8 MG PO TBDP
8.0000 mg | ORAL_TABLET | Freq: Once | ORAL | Status: AC
  Administered 2013-03-15: 8 mg via ORAL
  Filled 2013-03-15: qty 1

## 2013-03-15 MED ORDER — OXYCODONE-ACETAMINOPHEN 5-325 MG PO TABS
2.0000 | ORAL_TABLET | Freq: Once | ORAL | Status: AC
  Administered 2013-03-15: 2 via ORAL
  Filled 2013-03-15: qty 2

## 2013-03-15 MED ORDER — IBUPROFEN 400 MG PO TABS
600.0000 mg | ORAL_TABLET | Freq: Once | ORAL | Status: AC
  Administered 2013-03-15: 600 mg via ORAL
  Filled 2013-03-15 (×2): qty 1

## 2013-03-15 NOTE — ED Notes (Signed)
Pt having lower back pain radiating down right leg since September.  Pt states she had steroid injection on Thursday for same and now pain is worse.  No numbness or tingling.  No elimination problems.  Painful ambulation.

## 2013-03-15 NOTE — ED Provider Notes (Signed)
CSN: [id number]     Arrival date & time 03/15/13  1051 History   First MD Initiated Contact with Patient 03/15/13 1212     Chief Complaint  Patient presents with  . Back Pain  . Leg Pain   (Consider location/radiation/quality/duration/timing/severity/associated sxs/prior Treatment) HPI Comments: Pt comes in with cc of lower back pain. Pt has hx of bulges disk, and had epidural infection done yday. Her pain got worse last night, and is radiating down her right leg. Pt has no associated numbness, weakness, urinary incontinence, urinary retention, bowel incontinence, saddle anesthesia. The pain is worse with sitting position or walking.    Patient is a 37 y.o. female presenting with back pain and leg pain. The history is provided by the patient.  Back Pain Associated symptoms: leg pain   Associated symptoms: no abdominal pain, no chest pain, no dysuria, no fever and no headaches   Leg Pain Associated symptoms: back pain   Associated symptoms: no fever and no neck pain     Past Medical History  Diagnosis Date  . PONV (postoperative nausea and vomiting)   . Hx of cholecystectomy 2004  . PCOS (polycystic ovarian syndrome)     Dr. Elvera Lennox  . Endometriosis    Past Surgical History  Procedure Laterality Date  . Cholecystectomy    . Diagnostic laparoscopy    . Dilation and evacuation  02/20/2012    Procedure: DILATATION AND EVACUATION;  Surgeon: Leslie Andrea, MD;  Location: [facility];  Service: Gynecology;  Laterality: N/A;  chromsomes studies  . Wisdom tooth extraction    . Oophorectomy     Family History  Problem Relation Age of Onset  . Hyperlipidemia Other     All Grandparents  . Hypertension Other     All Grandparents  . Lung cancer Paternal Grandfather   . Heart disease Paternal Grandmother   . Healthy Mother   . Healthy Father    History  Substance Use Topics  . Smoking status: Never Smoker   . Smokeless tobacco: Never Used  . Alcohol Use: No   OB History   Grav Para Term Preterm Abortions TAB SAB Ect Mult Living   1              Review of Systems  Constitutional: Positive for activity change. Negative for fever.  Respiratory: Negative for shortness of breath.   Cardiovascular: Negative for chest pain.  Gastrointestinal: Negative for nausea, vomiting and abdominal pain.  Genitourinary: Negative for dysuria.  Musculoskeletal: Positive for back pain. Negative for neck pain.  Neurological: Negative for headaches.    Allergies  Review of patient's allergies indicates no known allergies.  Home Medications   Current Outpatient Rx  Name  Route  Sig  Dispense  Refill  . Multiple Vitamin (MULTI VITAMIN DAILY PO)   Oral   Take by mouth.         . ondansetron (ZOFRAN-ODT) 4 MG disintegrating tablet   Oral   Take 4 mg by mouth every 8 (eight) hours as needed for nausea or vomiting.         Marland Kitchen oxyCODONE-acetaminophen (PERCOCET/ROXICET) 5-325 MG per tablet   Oral   Take by mouth every 4 (four) hours as needed for severe pain.         Marland Kitchen spironolactone (ALDACTONE) 50 MG tablet   Oral   Take 1 tablet (50 mg total) by mouth 2 (two) times daily.   180 tablet   0    BP  128/62  Pulse 108  Temp(Src) 98.7 F (37.1 C) (Oral)  Resp 16  Ht 5\' 6"  (1.676 m)  Wt 156 lb (70.761 kg)  BMI 25.19 kg/m2  SpO2 99%  LMP 03/10/2013 Physical Exam  Constitutional: She is oriented to person, place, and time. She appears well-developed and well-nourished.  HENT:  Head: Normocephalic and atraumatic.  Eyes: EOM are normal. Pupils are equal, round, and reactive to light.  Neck: Neck supple.  Cardiovascular: Normal rate, regular rhythm and normal heart sounds.   No murmur heard. Pulmonary/Chest: Effort normal. No respiratory distress.  Abdominal: Soft. She exhibits no distension. There is no tenderness. There is no rebound and no guarding.  Musculoskeletal:  Pt has tenderness over the lumbar region and sacral region. No step offs, no erythema. Pt  has 2+ patellar reflex bilaterally. Able to discriminate between sharp and dull.    Neurological: She is alert and oriented to person, place, and time.  Skin: Skin is warm and dry.    ED Course  Procedures (including critical care time) Labs Review Labs Reviewed - No data to display Imaging Review No results found.  EKG Interpretation   None       MDM  No diagnosis found.  DDx includes: - DJD of the back - Spondylitises/ spondylosis - Sciatica - Spinal cord compression - Conus medullaris - Epidural hematoma - Epidural abscess - Lytic/pathologic fracture - Myelitis - Musculoskeletal pain  Pt comes in with cc of back pain. Pt has hx of bulged disk, and is having pain radiating down her leg. Her neuro exam is normal, no refflags for spinal cord involvement. Straight leg was neg too.  Will ask her to continue her nsaids and percocet. Will add muscle relaxant and 3 day course of prednisone.   Derwood Kaplan, MD 03/15/13 1325

## 2013-03-18 ENCOUNTER — Encounter: Payer: Self-pay | Admitting: Family Medicine

## 2013-03-20 ENCOUNTER — Emergency Department (HOSPITAL_COMMUNITY)
Admission: EM | Admit: 2013-03-20 | Discharge: 2013-03-20 | Disposition: A | Discharge status: Home / Self Care | Payer: Managed Care, Other (non HMO) | Attending: Emergency Medicine | Admitting: Emergency Medicine

## 2013-03-20 ENCOUNTER — Encounter (HOSPITAL_COMMUNITY): Payer: Self-pay | Admitting: Emergency Medicine

## 2013-03-20 ENCOUNTER — Other Ambulatory Visit: Payer: Self-pay | Admitting: Anesthesiology

## 2013-03-20 DIAGNOSIS — Z862 Personal history of diseases of the blood and blood-forming organs and certain disorders involving the immune mechanism: Secondary | ICD-10-CM | POA: Insufficient documentation

## 2013-03-20 DIAGNOSIS — IMO0002 Reserved for concepts with insufficient information to code with codable children: Secondary | ICD-10-CM | POA: Insufficient documentation

## 2013-03-20 DIAGNOSIS — Z8639 Personal history of other endocrine, nutritional and metabolic disease: Secondary | ICD-10-CM | POA: Insufficient documentation

## 2013-03-20 DIAGNOSIS — M5126 Other intervertebral disc displacement, lumbar region: Secondary | ICD-10-CM

## 2013-03-20 DIAGNOSIS — M5416 Radiculopathy, lumbar region: Secondary | ICD-10-CM

## 2013-03-20 DIAGNOSIS — Z9089 Acquired absence of other organs: Secondary | ICD-10-CM | POA: Insufficient documentation

## 2013-03-20 DIAGNOSIS — Z79899 Other long term (current) drug therapy: Secondary | ICD-10-CM | POA: Insufficient documentation

## 2013-03-20 DIAGNOSIS — Z8742 Personal history of other diseases of the female genital tract: Secondary | ICD-10-CM | POA: Insufficient documentation

## 2013-03-20 NOTE — ED Provider Notes (Signed)
CSN: [id number]     Arrival date & time 03/20/13  1452 History   First MD Initiated Contact with Patient 03/20/13 1805     Chief Complaint  Patient presents with  . Back Pain   (Consider location/radiation/quality/duration/timing/severity/associated sxs/prior Treatment) The history is provided by the patient and medical records.   This is a 37 year old female presenting to the ED for low back pain. Patient states she has herniated discs in her lumbar spine. She was recently seen by Dr. Ollen Bowl, and received an epidural steroid injection one week ago. States she's been having increased pain since then. States now pain is radiating down the entire length of her right leg and causing severe paresthesias of her right foot. Patient was seen in the ED on 03/15/2013 for this and sent home with pain medications.  The patient did followup with Dr. Norville Haggard on Monday, and was scheduled for a myelogram study but earliest appt is 1 week from now on 03/27/13.  Pt states pain is unbearable at this time and she cannot wait until her appt.  Patient notes she has had some difficulty urinating-- states when she sits down to use the bathroom it takes a few minutes for her bladder to relax and she can urinate. She has not had any bowel or bladder incontinence.  Pt states she has not had a full BM in approx 1 week but she thinks it is because of the narcotic pain medications she has been taking.  VS stable on arrival.  Past Medical History  Diagnosis Date  . PONV (postoperative nausea and vomiting)   . Hx of cholecystectomy 2004  . PCOS (polycystic ovarian syndrome)     Dr. Elvera Lennox  . Endometriosis   . Lower back pain 2014    Injection 03/14/13.  ED visit 03/15/13.   Past Surgical History  Procedure Laterality Date  . Cholecystectomy    . Diagnostic laparoscopy    . Dilation and evacuation  02/20/2012    Procedure: DILATATION AND EVACUATION;  Surgeon: Leslie Andrea, MD;  Location: [facility];  Service:  Gynecology;  Laterality: N/A;  chromsomes studies  . Wisdom tooth extraction    . Oophorectomy     Family History  Problem Relation Age of Onset  . Hyperlipidemia Other     All Grandparents  . Hypertension Other     All Grandparents  . Lung cancer Paternal Grandfather   . Heart disease Paternal Grandmother   . Healthy Mother   . Healthy Father    History  Substance Use Topics  . Smoking status: Never Smoker   . Smokeless tobacco: Never Used  . Alcohol Use: No   OB History   Grav Para Term Preterm Abortions TAB SAB Ect Mult Living   1              Review of Systems  Musculoskeletal: Positive for back pain.  All other systems reviewed and are negative.    Allergies  Review of patient's allergies indicates no known allergies.  Home Medications   Current Outpatient Rx  Name  Route  Sig  Dispense  Refill  . diazepam (VALIUM) 10 MG tablet   Oral   Take 10 mg by mouth every 6 (six) hours as needed for anxiety (and muscle relaxer).         . ondansetron (ZOFRAN) 4 MG tablet   Oral   Take 4 mg by mouth every 8 (eight) hours as needed for nausea or vomiting.         Marland Kitchen  oxyCODONE-acetaminophen (PERCOCET/ROXICET) 5-325 MG per tablet   Oral   Take 2 tablets by mouth every 4 (four) hours as needed for severe pain.         . pregabalin (LYRICA) 150 MG capsule   Oral   Take 150 mg by mouth 3 (three) times daily.         Marland Kitchen spironolactone (ALDACTONE) 50 MG tablet   Oral   Take 1 tablet (50 mg total) by mouth 2 (two) times daily.   180 tablet   0    BP 144/69  Pulse 104  Temp(Src) 97.4 F (36.3 C) (Oral)  Resp 20  SpO2 100%  LMP 03/10/2013  Physical Exam  Nursing note and vitals reviewed. Constitutional: She is oriented to person, place, and time. She appears well-developed and well-nourished. No distress.  HENT:  Head: Normocephalic and atraumatic.  Mouth/Throat: Oropharynx is clear and moist.  Eyes: Conjunctivae and EOM are normal. Pupils are equal,  round, and reactive to light.  Neck: Normal range of motion. Neck supple.  Cardiovascular: Normal rate, regular rhythm and normal heart sounds.   Pulmonary/Chest: Effort normal and breath sounds normal. No respiratory distress. She has no wheezes.  Abdominal: Soft. Bowel sounds are normal. There is no tenderness. There is no guarding.  Abdomen soft, non-distended  Musculoskeletal: Normal range of motion. She exhibits no edema.  Right lumbar injection site clean without signs of infection; lumbar spine diffusely TTP, no step-off or gross deformity; limited ROM due to pain; strong distal pulse and cap refill; sensation intact; reflexes 2+ bilaterally  Neurological: She is alert and oriented to person, place, and time. She has normal strength. She displays no tremor. No cranial nerve deficit or sensory deficit. She displays no seizure activity.  CN grossly intact, moves all extremities appropriately without ataxia, no focal neuro deficits or facial droop appreciated; sensation to light touch intact diffusely; no loss of bowel or bladder function; no saddle numbness  Skin: Skin is warm and dry. She is not diaphoretic.  Psychiatric: She has a normal mood and affect.    ED Course  Procedures (including critical care time) Labs Review Labs Reviewed - No data to display Imaging Review No results found.  EKG Interpretation   None       MDM   1. Lumbar radiculopathy    Pt does not have any focal neuro deficits on physical exam or findings concerning for cauda equina.  Spoke with Dr. Ollen Bowl-- states myelogram is what he needs but she was given the first emergent appt available but he did not specifically tell pt to come to the ED.  I have advised Dr. Ollen Bowl that IR is not here to perform myelogram and we do not perform those in the ED on a routine basis.  Dr. Ollen Bowl recommend that pt continue her pain regimen until appt time or she may contact him in the morning.  I offered pt IV/IM pain  medications multiple times during her ED visit, she declined.    Garlon Hatchet, PA-C 03/20/13 2053

## 2013-03-20 NOTE — ED Provider Notes (Signed)
Medical screening examination/treatment/procedure(s) were performed by non-physician practitioner and as supervising physician I was immediately available for consultation/collaboration.   Charles B. Sheldon, MD 03/20/13 2355 

## 2013-03-20 NOTE — ED Notes (Signed)
Pt reports having lower back pain, had epidural steroid injection on Thursday, has been having increase in pain since then, having pain that's radiating down right leg to her foot, having numbness to right foot, difficulty ambulating and difficulty urinating, no bowel movement x 1 week.

## 2013-03-25 ENCOUNTER — Inpatient Hospital Stay
Admission: RE | Admit: 2013-03-25 | Discharge: 2013-03-25 | Disposition: A | Discharge status: Home / Self Care | Payer: Self-pay | Source: Ambulatory Visit | Attending: Anesthesiology | Admitting: Anesthesiology

## 2013-03-25 ENCOUNTER — Ambulatory Visit
Admission: RE | Admit: 2013-03-25 | Discharge: 2013-03-25 | Disposition: A | Discharge status: Home / Self Care | Payer: Managed Care, Other (non HMO) | Source: Ambulatory Visit | Attending: Anesthesiology | Admitting: Anesthesiology

## 2013-03-25 ENCOUNTER — Other Ambulatory Visit: Payer: Self-pay | Admitting: Anesthesiology

## 2013-03-25 VITALS — BP 123/71 | HR 71

## 2013-03-25 DIAGNOSIS — R52 Pain, unspecified: Secondary | ICD-10-CM

## 2013-03-25 DIAGNOSIS — M5126 Other intervertebral disc displacement, lumbar region: Secondary | ICD-10-CM

## 2013-03-25 MED ORDER — ONDANSETRON HCL 4 MG/2ML IJ SOLN
4.0000 mg | Freq: Once | INTRAMUSCULAR | Status: AC
  Administered 2013-03-25: 4 mg via INTRAMUSCULAR

## 2013-03-25 MED ORDER — IOHEXOL 180 MG/ML  SOLN
17.0000 mL | Freq: Once | INTRAMUSCULAR | Status: AC | PRN
  Administered 2013-03-25: 17 mL via INTRATHECAL

## 2013-03-25 MED ORDER — MEPERIDINE HCL 100 MG/ML IJ SOLN
100.0000 mg | Freq: Once | INTRAMUSCULAR | Status: AC
  Administered 2013-03-25: 100 mg via INTRAMUSCULAR

## 2013-03-25 NOTE — Progress Notes (Signed)
Husband at bedside.  Patient resting quietly.  Though still uncomfortable,  she states pain is back down to where it was when she arrived here (5/10).  jkl

## 2013-03-25 NOTE — Progress Notes (Signed)
Patient took Valium 10mg  PO at home before procedure, so we did not give any here pre-procedure.  Patient took own Zofran 4mg  PO here, though we will give her more IM with pain medication.  Donell Sievert, RN

## 2013-04-01 ENCOUNTER — Other Ambulatory Visit: Payer: Self-pay | Admitting: Neurosurgery

## 2013-04-01 ENCOUNTER — Encounter (HOSPITAL_COMMUNITY): Payer: Self-pay

## 2013-04-01 ENCOUNTER — Encounter (HOSPITAL_COMMUNITY): Payer: Self-pay | Admitting: *Deleted

## 2013-04-01 ENCOUNTER — Other Ambulatory Visit (HOSPITAL_COMMUNITY): Payer: Self-pay | Admitting: *Deleted

## 2013-04-01 NOTE — H&P (Signed)
Sherri Copeland is an 37 y.o. female.   Chief Complaint: right leg pain HPI: patient seen in my office 48 hours ago because of right leg pain associated with urgency to urinate. Last week i did talk to her by phone after i saw the myelo ordered by dr Ollen Bowl. She came in a wheel chair with the right buttock lifted to prevent the pain  Past Medical History  Diagnosis Date  . PONV (postoperative nausea and vomiting)   . Hx of cholecystectomy 2004  . PCOS (polycystic ovarian syndrome)     Dr. Elvera Lennox  . Endometriosis   . Lower back pain 2014    Injection 03/14/13.  ED visit 03/15/13.  . Pneumonia   . Headache(784.0)     Migraines  . H/O irritable bowel syndrome     Past Surgical History  Procedure Laterality Date  . Cholecystectomy    . Diagnostic laparoscopy    . Dilation and evacuation  02/20/2012    Procedure: DILATATION AND EVACUATION;  Surgeon: Leslie Andrea, MD;  Location: [facility];  Service: Gynecology;  Laterality: N/A;  chromsomes studies  . Wisdom tooth extraction    . Oophorectomy      Family History  Problem Relation Age of Onset  . Hyperlipidemia Other     All Grandparents  . Hypertension Other     All Grandparents  . Lung cancer Paternal Grandfather   . Heart disease Paternal Grandmother   . Healthy Mother   . Healthy Father    Social History:  reports that she has never smoked. She has never used smokeless tobacco. She reports that she does not drink alcohol or use illicit drugs.  Allergies: No Known Allergies  No prescriptions prior to admission    No results found for this or any previous visit (from the past 48 hour(s)). No results found.  Review of Systems  Constitutional: Negative.   HENT: Negative.   Eyes: Negative.   Respiratory: Negative.   Cardiovascular: Negative.   Gastrointestinal: Negative.   Genitourinary: Positive for urgency.  Musculoskeletal: Positive for back pain. NEURO weakness of PF. absent right dtr. slr positive at 30.   Skin: Positive for itching.  Neurological: Positive for sensory change and focal weakness.  Endo/Heme/Allergies: Negative.   Psychiatric/Behavioral: Negative.     Last menstrual period 03/12/2013. Physical Exam hent, nl. Neck, nl. Cv, nl. Lungs, clear. Abdomen,soft. Extremities, nl.neuro weaknes of PF. slr POSITIVE AT 30. NO ANKLE DTR.  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 She and her husband are aware of risks and benefits  Tavius Turgeon M 04/01/2013, 5:52 PM

## 2013-04-02 ENCOUNTER — Ambulatory Visit (HOSPITAL_COMMUNITY): Payer: Managed Care, Other (non HMO) | Admitting: Anesthesiology

## 2013-04-02 ENCOUNTER — Ambulatory Visit (HOSPITAL_COMMUNITY): Payer: Managed Care, Other (non HMO)

## 2013-04-02 ENCOUNTER — Inpatient Hospital Stay (HOSPITAL_COMMUNITY)
Admission: RE | Admit: 2013-04-02 | Discharge: 2013-04-02 | DRG: 520 | Disposition: A | Discharge status: Home / Self Care | Payer: Managed Care, Other (non HMO) | Source: Ambulatory Visit | Attending: Neurosurgery | Admitting: Neurosurgery

## 2013-04-02 ENCOUNTER — Encounter (HOSPITAL_COMMUNITY): Payer: Self-pay | Admitting: *Deleted

## 2013-04-02 ENCOUNTER — Encounter (HOSPITAL_COMMUNITY): Payer: Managed Care, Other (non HMO) | Admitting: Anesthesiology

## 2013-04-02 ENCOUNTER — Encounter (HOSPITAL_COMMUNITY)
Admission: RE | Disposition: A | Discharge status: Home / Self Care | Payer: Self-pay | Source: Ambulatory Visit | Attending: Neurosurgery

## 2013-04-02 DIAGNOSIS — M5126 Other intervertebral disc displacement, lumbar region: Principal | ICD-10-CM | POA: Diagnosis present

## 2013-04-02 DIAGNOSIS — K589 Irritable bowel syndrome without diarrhea: Secondary | ICD-10-CM | POA: Diagnosis present

## 2013-04-02 DIAGNOSIS — E282 Polycystic ovarian syndrome: Secondary | ICD-10-CM | POA: Diagnosis present

## 2013-04-02 HISTORY — DX: Personal history of other diseases of the digestive system: Z87.19

## 2013-04-02 HISTORY — DX: Pneumonia, unspecified organism: J18.9

## 2013-04-02 HISTORY — DX: Headache: R51

## 2013-04-02 HISTORY — PX: LUMBAR LAMINECTOMY/DECOMPRESSION MICRODISCECTOMY: SHX5026

## 2013-04-02 LAB — CBC
HCT: 43.2 % (ref 36.0–46.0)
Hemoglobin: 14.7 g/dL (ref 12.0–15.0)
MCHC: 34 g/dL (ref 30.0–36.0)
MCV: 96.6 fL (ref 78.0–100.0)

## 2013-04-02 LAB — SURGICAL PCR SCREEN: Staphylococcus aureus: NEGATIVE

## 2013-04-02 SURGERY — LUMBAR LAMINECTOMY/DECOMPRESSION MICRODISCECTOMY 1 LEVEL
Anesthesia: General | Laterality: Right | Wound class: Clean

## 2013-04-02 MED ORDER — PHENOL 1.4 % MT LIQD: 1.0000 | OROMUCOSAL | Status: DC | PRN

## 2013-04-02 MED ORDER — FENTANYL CITRATE 0.05 MG/ML IJ SOLN
INTRAMUSCULAR | Status: AC
  Filled 2013-04-02: qty 2

## 2013-04-02 MED ORDER — METOCLOPRAMIDE HCL 5 MG/ML IJ SOLN
INTRAMUSCULAR | Status: DC | PRN
  Administered 2013-04-02: 10 mg via INTRAVENOUS

## 2013-04-02 MED ORDER — HEMOSTATIC AGENTS (NO CHARGE) OPTIME
TOPICAL | Status: DC | PRN
  Administered 2013-04-02: 1 via TOPICAL

## 2013-04-02 MED ORDER — THROMBIN 5000 UNITS EX SOLR
CUTANEOUS | Status: DC | PRN
  Administered 2013-04-02 (×2): 5000 [IU] via TOPICAL

## 2013-04-02 MED ORDER — PROPOFOL 10 MG/ML IV BOLUS
INTRAVENOUS | Status: DC | PRN
  Administered 2013-04-02: 200 mg via INTRAVENOUS
  Administered 2013-04-02: 20 mg via INTRAVENOUS
  Administered 2013-04-02: 30 mg via INTRAVENOUS

## 2013-04-02 MED ORDER — MIDAZOLAM HCL 5 MG/5ML IJ SOLN
INTRAMUSCULAR | Status: DC | PRN
  Administered 2013-04-02 (×2): .25 mg via INTRAVENOUS
  Administered 2013-04-02: 1 mg via INTRAVENOUS
  Administered 2013-04-02: 0.5 mg via INTRAVENOUS

## 2013-04-02 MED ORDER — SODIUM CHLORIDE 0.9 % IJ SOLN
3.0000 mL | Freq: Two times a day (BID) | INTRAMUSCULAR | Status: DC
  Administered 2013-04-02: 3 mL via INTRAVENOUS

## 2013-04-02 MED ORDER — METOCLOPRAMIDE HCL 5 MG/ML IJ SOLN: 10.0000 mg | Freq: Once | INTRAMUSCULAR | Status: DC | PRN

## 2013-04-02 MED ORDER — SCOPOLAMINE 1 MG/3DAYS TD PT72
1.0000 | MEDICATED_PATCH | Freq: Once | TRANSDERMAL | Status: AC
  Administered 2013-04-02: 1 via TRANSDERMAL
  Filled 2013-04-02: qty 1

## 2013-04-02 MED ORDER — ONDANSETRON HCL 4 MG/2ML IJ SOLN: 4.0000 mg | INTRAMUSCULAR | Status: DC | PRN

## 2013-04-02 MED ORDER — METHYLPREDNISOLONE ACETATE 80 MG/ML IJ SUSP
INTRAMUSCULAR | Status: DC | PRN
  Administered 2013-04-02: 80 mg

## 2013-04-02 MED ORDER — OXYCODONE HCL 5 MG PO TABS: 5.0000 mg | ORAL_TABLET | Freq: Once | ORAL | Status: DC | PRN

## 2013-04-02 MED ORDER — NEOSTIGMINE METHYLSULFATE 1 MG/ML IJ SOLN
INTRAMUSCULAR | Status: DC | PRN
  Administered 2013-04-02: 3 mg via INTRAVENOUS

## 2013-04-02 MED ORDER — SODIUM CHLORIDE 0.9 % IJ SOLN: 3.0000 mL | INTRAMUSCULAR | Status: DC | PRN

## 2013-04-02 MED ORDER — BACITRACIN ZINC 500 UNIT/GM EX OINT
TOPICAL_OINTMENT | CUTANEOUS | Status: DC | PRN
  Administered 2013-04-02: 1 via TOPICAL

## 2013-04-02 MED ORDER — BUPIVACAINE-EPINEPHRINE PF 0.5-1:200000 % IJ SOLN
INTRAMUSCULAR | Status: DC | PRN
  Administered 2013-04-02: 10 mL via PERINEURAL

## 2013-04-02 MED ORDER — LIDOCAINE HCL (CARDIAC) 20 MG/ML IV SOLN
INTRAVENOUS | Status: DC | PRN
  Administered 2013-04-02: 100 mg via INTRAVENOUS

## 2013-04-02 MED ORDER — ROCURONIUM BROMIDE 100 MG/10ML IV SOLN
INTRAVENOUS | Status: DC | PRN
  Administered 2013-04-02: 50 mg via INTRAVENOUS

## 2013-04-02 MED ORDER — OXYCODONE HCL 5 MG/5ML PO SOLN: 5.0000 mg | Freq: Once | ORAL | Status: DC | PRN

## 2013-04-02 MED ORDER — OXYCODONE-ACETAMINOPHEN 5-325 MG PO TABS
1.0000 | ORAL_TABLET | ORAL | Status: DC | PRN
  Administered 2013-04-02: 2 via ORAL
  Filled 2013-04-02: qty 2

## 2013-04-02 MED ORDER — FENTANYL CITRATE 0.05 MG/ML IJ SOLN
INTRAMUSCULAR | Status: DC | PRN
  Administered 2013-04-02: 50 ug via INTRAVENOUS
  Administered 2013-04-02: 25 ug via INTRAVENOUS
  Administered 2013-04-02: 50 ug via INTRAVENOUS
  Administered 2013-04-02: 25 ug via INTRAVENOUS
  Administered 2013-04-02: 100 ug via INTRAVENOUS

## 2013-04-02 MED ORDER — ACETAMINOPHEN 650 MG RE SUPP: 650.0000 mg | RECTAL | Status: DC | PRN

## 2013-04-02 MED ORDER — ARTIFICIAL TEARS OP OINT
TOPICAL_OINTMENT | OPHTHALMIC | Status: DC | PRN
  Administered 2013-04-02: 1 via OPHTHALMIC

## 2013-04-02 MED ORDER — ACETAMINOPHEN 325 MG PO TABS: 650.0000 mg | ORAL_TABLET | ORAL | Status: DC | PRN

## 2013-04-02 MED ORDER — LACTATED RINGERS IV SOLN
INTRAVENOUS | Status: DC | PRN
  Administered 2013-04-02 (×3): via INTRAVENOUS

## 2013-04-02 MED ORDER — GLYCOPYRROLATE 0.2 MG/ML IJ SOLN
INTRAMUSCULAR | Status: DC | PRN
  Administered 2013-04-02: .4 mg via INTRAVENOUS

## 2013-04-02 MED ORDER — DEXAMETHASONE SODIUM PHOSPHATE 10 MG/ML IJ SOLN
INTRAMUSCULAR | Status: DC | PRN
  Administered 2013-04-02: 10 mg via INTRAVENOUS

## 2013-04-02 MED ORDER — MORPHINE SULFATE 2 MG/ML IJ SOLN
1.0000 mg | INTRAMUSCULAR | Status: DC | PRN
Start: 2013-04-02 — End: 2013-04-02

## 2013-04-02 MED ORDER — 0.9 % SODIUM CHLORIDE (POUR BTL) OPTIME
TOPICAL | Status: DC | PRN
  Administered 2013-04-02: 1000 mL

## 2013-04-02 MED ORDER — ONDANSETRON HCL 4 MG/2ML IJ SOLN
INTRAMUSCULAR | Status: DC | PRN
  Administered 2013-04-02 (×2): 4 mg via INTRAVENOUS

## 2013-04-02 MED ORDER — SODIUM CHLORIDE 0.9 % IV SOLN: INTRAVENOUS | Status: DC

## 2013-04-02 MED ORDER — ZOLPIDEM TARTRATE 5 MG PO TABS: 5.0000 mg | ORAL_TABLET | Freq: Every evening | ORAL | Status: DC | PRN

## 2013-04-02 MED ORDER — HYDROMORPHONE HCL PF 1 MG/ML IJ SOLN: 0.2500 mg | INTRAMUSCULAR | Status: DC | PRN

## 2013-04-02 MED ORDER — CEFAZOLIN SODIUM 1-5 GM-% IV SOLN
1.0000 g | Freq: Three times a day (TID) | INTRAVENOUS | Status: DC
  Administered 2013-04-02: 1 g via INTRAVENOUS
  Filled 2013-04-02 (×2): qty 50

## 2013-04-02 MED ORDER — DIAZEPAM 5 MG PO TABS: 5.0000 mg | ORAL_TABLET | Freq: Four times a day (QID) | ORAL | Status: DC | PRN

## 2013-04-02 MED ORDER — MUPIROCIN 2 % EX OINT
TOPICAL_OINTMENT | Freq: Two times a day (BID) | CUTANEOUS | Status: DC
  Filled 2013-04-02: qty 22

## 2013-04-02 MED ORDER — MUPIROCIN 2 % EX OINT
TOPICAL_OINTMENT | CUTANEOUS | Status: AC
  Administered 2013-04-02: 1 via NASAL
  Filled 2013-04-02: qty 22

## 2013-04-02 MED ORDER — MENTHOL 3 MG MT LOZG: 1.0000 | LOZENGE | OROMUCOSAL | Status: DC | PRN

## 2013-04-02 MED ORDER — CEFAZOLIN SODIUM-DEXTROSE 2-3 GM-% IV SOLR
INTRAVENOUS | Status: AC
  Administered 2013-04-02: 2 g via INTRAVENOUS
  Filled 2013-04-02: qty 50

## 2013-04-02 SURGICAL SUPPLY — 62 items
APL SKNCLS STERI-STRIP NONHPOA (GAUZE/BANDAGES/DRESSINGS) ×1
BENZOIN TINCTURE PRP APPL 2/3 (GAUZE/BANDAGES/DRESSINGS) ×2 IMPLANT
BLADE SURG ROTATE 9660 (MISCELLANEOUS) IMPLANT
BUR ACORN 6.0 (BURR) ×2 IMPLANT
BUR MATCHSTICK NEURO 3.0 LAGG (BURR) IMPLANT
CANISTER SUCT 3000ML (MISCELLANEOUS) ×2 IMPLANT
CONT SPEC 4OZ CLIKSEAL STRL BL (MISCELLANEOUS) ×2 IMPLANT
DRAPE LAPAROTOMY 100X72X124 (DRAPES) ×2 IMPLANT
DRAPE MICROSCOPE LEICA (MISCELLANEOUS) ×2 IMPLANT
DRAPE POUCH INSTRU U-SHP 10X18 (DRAPES) ×2 IMPLANT
DRESSING TELFA 8X3 (GAUZE/BANDAGES/DRESSINGS) ×1 IMPLANT
DRSG OPSITE 4X5.5 SM (GAUZE/BANDAGES/DRESSINGS) ×1 IMPLANT
DRSG PAD ABDOMINAL 8X10 ST (GAUZE/BANDAGES/DRESSINGS) IMPLANT
DURAPREP 26ML APPLICATOR (WOUND CARE) ×2 IMPLANT
ELECT BLADE 4.0 EZ CLEAN MEGAD (MISCELLANEOUS) ×2
ELECT REM PT RETURN 9FT ADLT (ELECTROSURGICAL) ×2
ELECTRODE BLDE 4.0 EZ CLN MEGD (MISCELLANEOUS) IMPLANT
ELECTRODE REM PT RTRN 9FT ADLT (ELECTROSURGICAL) ×1 IMPLANT
GAUZE SPONGE 4X4 16PLY XRAY LF (GAUZE/BANDAGES/DRESSINGS) IMPLANT
GLOVE BIOGEL M 8.0 STRL (GLOVE) ×2 IMPLANT
GLOVE BIOGEL PI IND STRL 7.5 (GLOVE) IMPLANT
GLOVE BIOGEL PI INDICATOR 7.5 (GLOVE) ×2
GLOVE ECLIPSE 7.0 STRL STRAW (GLOVE) ×2 IMPLANT
GLOVE EXAM NITRILE LRG STRL (GLOVE) IMPLANT
GLOVE EXAM NITRILE MD LF STRL (GLOVE) IMPLANT
GLOVE EXAM NITRILE XL STR (GLOVE) IMPLANT
GLOVE EXAM NITRILE XS STR PU (GLOVE) IMPLANT
GLOVE SS N UNI LF 7.0 STRL (GLOVE) ×2 IMPLANT
GOWN BRE IMP SLV AUR LG STRL (GOWN DISPOSABLE) ×4 IMPLANT
GOWN BRE IMP SLV AUR XL STRL (GOWN DISPOSABLE) IMPLANT
GOWN STRL REIN 2XL LVL4 (GOWN DISPOSABLE) IMPLANT
KIT BASIN OR (CUSTOM PROCEDURE TRAY) ×2 IMPLANT
KIT ROOM TURNOVER OR (KITS) ×2 IMPLANT
NDL HYPO 18GX1.5 BLUNT FILL (NEEDLE) IMPLANT
NDL HYPO 21X1.5 SAFETY (NEEDLE) IMPLANT
NDL HYPO 25X1 1.5 SAFETY (NEEDLE) IMPLANT
NDL SPNL 20GX3.5 QUINCKE YW (NEEDLE) IMPLANT
NEEDLE HYPO 18GX1.5 BLUNT FILL (NEEDLE) IMPLANT
NEEDLE HYPO 21X1.5 SAFETY (NEEDLE) ×2 IMPLANT
NEEDLE HYPO 25X1 1.5 SAFETY (NEEDLE) IMPLANT
NEEDLE SPNL 20GX3.5 QUINCKE YW (NEEDLE) ×2 IMPLANT
NS IRRIG 1000ML POUR BTL (IV SOLUTION) ×2 IMPLANT
PACK LAMINECTOMY NEURO (CUSTOM PROCEDURE TRAY) ×2 IMPLANT
PAD ARMBOARD 7.5X6 YLW CONV (MISCELLANEOUS) ×6 IMPLANT
PATTIES SURGICAL .5 X1 (DISPOSABLE) ×2 IMPLANT
RUBBERBAND STERILE (MISCELLANEOUS) ×4 IMPLANT
SPONGE GAUZE 4X4 12PLY (GAUZE/BANDAGES/DRESSINGS) ×2 IMPLANT
SPONGE LAP 4X18 X RAY DECT (DISPOSABLE) IMPLANT
SPONGE SURGIFOAM ABS GEL SZ50 (HEMOSTASIS) ×2 IMPLANT
STRIP CLOSURE SKIN 1/2X4 (GAUZE/BANDAGES/DRESSINGS) ×2 IMPLANT
SUT VIC AB 0 CT1 18XCR BRD8 (SUTURE) ×1 IMPLANT
SUT VIC AB 0 CT1 8-18 (SUTURE) ×2
SUT VIC AB 2-0 CP2 18 (SUTURE) ×2 IMPLANT
SUT VIC AB 3-0 SH 8-18 (SUTURE) ×2 IMPLANT
SUT VICRYL 4-0 PS2 18IN ABS (SUTURE) ×1 IMPLANT
SYR 20CC LL (SYRINGE) IMPLANT
SYR 20ML ECCENTRIC (SYRINGE) ×2 IMPLANT
SYR 5ML LL (SYRINGE) IMPLANT
TAPE CLOTH SURG 4X10 WHT LF (GAUZE/BANDAGES/DRESSINGS) ×2 IMPLANT
TOWEL OR 17X24 6PK STRL BLUE (TOWEL DISPOSABLE) ×2 IMPLANT
TOWEL OR 17X26 10 PK STRL BLUE (TOWEL DISPOSABLE) ×2 IMPLANT
WATER STERILE IRR 1000ML POUR (IV SOLUTION) ×2 IMPLANT

## 2013-04-02 NOTE — Discharge Summary (Signed)
Physician Discharge Summary  Patient ID: JAYLIE NEAVES MRN: [id number] DOB/AGE: October 21, 1975 37 y.o.  Admit date: 04/02/2013 Discharge date: 04/02/2013  Admission Diagnoses:right l5s1 hnp  Discharge Diagnoses: same Active Problems:   Lumbar herniated disc   Discharged Condition: no pain  Hospital Course:surgery  Consults: none  Significant Diagnostic Studies:mri  Treatments:discectomy right l5s1  Discharge Exam: Blood pressure 110/71, pulse 69, temperature 98.4 F (36.9 C), temperature source Oral, resp. rate 20, last menstrual period 03/12/2013, SpO2 94.00%. No weakness  Disposition: 01-Home or Self Care     Medication List    ASK your doctor about these medications       diazepam 10 MG tablet  Commonly known as:  VALIUM  Take 10 mg by mouth every 6 (six) hours as needed for anxiety (and muscle relaxer).     ibuprofen 200 MG tablet  Commonly known as:  ADVIL,MOTRIN  Take 200 mg by mouth every 6 (six) hours as needed.     ondansetron 4 MG tablet  Commonly known as:  ZOFRAN  Take 4 mg by mouth every 8 (eight) hours as needed for nausea or vomiting.     oxyCODONE-acetaminophen 5-325 MG per tablet  Commonly known as:  PERCOCET/ROXICET  Take 2 tablets by mouth every 4 (four) hours as needed for moderate pain.     oxyCODONE-acetaminophen 10-325 MG per tablet  Commonly known as:  PERCOCET  Take 1 tablet by mouth every 4 (four) hours as needed (severe pain).     polyethylene glycol packet  Commonly known as:  MIRALAX / GLYCOLAX  Take 17 g by mouth daily.     pregabalin 150 MG capsule  Commonly known as:  LYRICA  Take 150 mg by mouth 3 (three) times daily.         Signed: Karn Cassis 04/02/2013, 5:47 PM

## 2013-04-02 NOTE — Anesthesia Postprocedure Evaluation (Signed)
Anesthesia Post Note  Patient: Sherri Copeland  Procedure(s) Performed: Procedure(s) (LRB): RIGHT LUMBAR FIVE TO SACRAL ONE LUMBAR LAMINECTOMY/DECOMPRESSION MICRODISCECTOMY 1 LEVEL (Right)  Anesthesia type: General  Patient location: PACU  Post pain: Pain level controlled  Post assessment: Patient's Cardiovascular Status Stable  Last Vitals:  Filed Vitals:   04/02/13 1103  BP: 154/58  Pulse: 80  Temp:   Resp: 13    Post vital signs: Reviewed and stable  Level of consciousness: alert  Complications: No apparent anesthesia complications

## 2013-04-02 NOTE — OR Nursing (Signed)
Fentanyl 50 mcg/ml total 2 ml used at surgical site by Dr Botero 

## 2013-04-02 NOTE — Evaluation (Signed)
Occupational Therapy Evaluation Patient Details Name: Sherri Copeland MRN: [id number] DOB: 1975-09-02 Today's Date: 04/02/2013 Time: 4098-1191 OT Time Calculation (min): 36 min  OT Assessment / Plan / Recommendation History of present illness 37 y.o. s/p RIGHT LUMBAR FIVE TO SACRAL ONE LUMBAR LAMINECTOMY/DECOMPRESSION MICRODISCECTOMY 1 LEVEL (Right)   Clinical Impression   Pt s/p above procedure. OT provided education to both pt and spouse. Pt very appreciative. Feel pt is safe to d/c home with spouse available to assist 24/7.     OT Assessment  Patient does not need any further OT services    Follow Up Recommendations  No OT follow up;Supervision/Assistance - 24 hour    Barriers to Discharge      Equipment Recommendations  3 in 1 bedside comode;Other (comment) (AE; walker)    Recommendations for Other Services    Frequency       Precautions / Restrictions Precautions Precautions: Back Precaution Booklet Issued: Yes (comment) Precaution Comments: Reviewed precautions with pt and spouse present   Pertinent Vitals/Pain Pain 4/10. Increased activity during session.     ADL  Upper Body Dressing: Set up;Supervision/safety Where Assessed - Upper Body Dressing: Unsupported sitting Lower Body Dressing: Minimal assistance Where Assessed - Lower Body Dressing: Supported sit to stand Toilet Transfer: Hydrographic surveyor Method: Sit to Barista: Other (comment) (from bed) Tub/Shower Transfer: Simulated;Min guard Tub/Shower Transfer Method: Science writer: Walk in Scientist, research (physical sciences) Used: Gait belt;Reacher;Long-handled sponge;Long-handled shoe horn;Rolling walker;Sock aid Transfers/Ambulation Related to ADLs: Min guard ADL Comments: Educated on AE for LB ADLs and pt practiced with reacher and sockaid. OT educated on toilet aid. Pt practiced simulated shower transfer. Recommended someone being with her for bathing and getting  in and out of shower. Educated to sit to get LB clothing over feet and to stand in front of bed/chair with walker in front when pulling them up. Educated on use of bag on walker and also safe shoe wear. Educated on using two cups for teeth care and having grooming items on right side of sink to avoid breaking precautions.     OT Diagnosis:    OT Problem List:   OT Treatment Interventions:     OT Goals(Current goals can be found in the care plan section)    Visit Information  Last OT Received On: 04/02/13 Assistance Needed: +1 History of Present Illness: 37 y.o. s/p RIGHT LUMBAR FIVE TO SACRAL ONE LUMBAR LAMINECTOMY/DECOMPRESSION MICRODISCECTOMY 1 LEVEL (Right)       Prior Functioning     Home Living Family/patient expects to be discharged to:: Private residence Living Arrangements: Spouse/significant other;Children Available Help at Discharge: Family;Available 24 hours/day Type of Home: House Home Access: Stairs to enter [employer] of Steps: 2 Entrance Stairs-Rails: None Home Layout: Two level;Able to live on main level with bedroom/bathroom Alternate Level Stairs-Number of Steps: flight Alternate Level Stairs-Rails: Left;Right (portion without rail) Home Equipment: Wheelchair - manual;Hand held shower head Prior Function Level of Independence: Needs assistance Gait / Transfers Assistance Needed: needed assistance with ambulation and transfers ADL's / Homemaking Assistance Needed: assistance with dressing Communication Communication: No difficulties Dominant Hand: Right         Vision/Perception     Cognition  Cognition Arousal/Alertness: Awake/alert Behavior During Therapy: WFL for tasks assessed/performed Overall Cognitive Status: Within Functional Limits for tasks assessed    Extremity/Trunk Assessment Upper Extremity Assessment Upper Extremity Assessment: Overall WFL for tasks assessed Lower Extremity Assessment Lower Extremity Assessment: Defer  to PT evaluation  Mobility Bed Mobility Bed Mobility: Rolling Right;Right Sidelying to Sit;Sitting - Scoot to [employer] of Bed;Sit to Sidelying Right;Rolling Left Rolling Right: 5: Supervision Rolling Left: 4: Min guard Right Sidelying to Sit: 4: Min guard Sitting - Scoot to Edge of Bed: 5: Supervision Sit to Sidelying Right: 4: Min guard Details for Bed Mobility Assistance: Cues for log roll technique. Transfers Transfers: Sit to Stand;Stand to Sit Sit to Stand: 4: Min guard;With upper extremity assist;From bed Stand to Sit: 4: Min guard;To bed Details for Transfer Assistance: Cues for hand placement.     Exercise     Balance     End of Session OT - End of Session Equipment Utilized During Treatment: Gait belt;Rolling walker Activity Tolerance: Patient tolerated treatment well Patient left: in bed;with call bell/phone within reach;with family/visitor present  GO     Earlie Raveling OTR/L [phone] 04/02/2013, 5:14 PM

## 2013-04-02 NOTE — Preoperative (Signed)
Beta Blockers   Reason not to administer Beta Blockers:Not Applicable 

## 2013-04-02 NOTE — Anesthesia Procedure Notes (Signed)
Procedure Name: Intubation Date/Time: 04/02/2013 9:16 AM Performed by: Sherie Don Pre-anesthesia Checklist: Patient identified, Emergency Drugs available, Suction available, Patient being monitored and Timeout performed Patient Re-evaluated:Patient Re-evaluated prior to inductionOxygen Delivery Method: Circle system utilized Preoxygenation: Pre-oxygenation with 100% oxygen Intubation Type: IV induction Ventilation: Mask ventilation without difficulty Laryngoscope Size: Mac and 3 Grade View: Grade II Tube type: Oral Tube size: 7.0 mm Number of attempts: 1 Airway Equipment and Method: Stylet Placement Confirmation: ETT inserted through vocal cords under direct vision,  positive ETCO2 and breath sounds checked- equal and bilateral Secured at: 21 cm Tube secured with: Tape Dental Injury: Teeth and Oropharynx as per pre-operative assessment

## 2013-04-02 NOTE — Progress Notes (Signed)
  Pt. Alert and oriented,follows simple instructions, denies pain. Incision area without swelling, redness or S/S of infection. Voiding adequate clear yellow urine. Moving all extremities well and vitals stable and documented. Patient discharged home with spouse. Lumbar surgery notes instructions given to patient and family member for home safety and precautions. Pt. and family stated understanding of instructions given.  

## 2013-04-02 NOTE — Progress Notes (Signed)
Op note [phone]

## 2013-04-02 NOTE — Discharge Summary (Signed)
Physician Discharge Summary  Patient ID: Sherri Copeland MRN: [id number] DOB/AGE: Aug 01, 1975 38 y.o.  Admit date: 04/02/2013 Discharge date: 04/02/2013  Admission Diagnoses:right l5s1 hnp  Discharge Diagnoses: same Active Problems:   Lumbar herniated disc   Discharged Condition:no weakness  Hospital Course: surgery  Consults none  Significant Diagnostic Studies:mri  Treatments:right l5s1 discectomy  Discharge Exam: Blood pressure 110/71, pulse 69, temperature 98.4 F (36.9 C), temperature source Oral, resp. rate 20, last menstrual period 03/12/2013, SpO2 94.00%. No weakness  Disposition: home     Medication List    ASK your doctor about these medications       diazepam 10 MG tablet  Commonly known as:  VALIUM  Take 10 mg by mouth every 6 (six) hours as needed for anxiety (and muscle relaxer).     ibuprofen 200 MG tablet  Commonly known as:  ADVIL,MOTRIN  Take 200 mg by mouth every 6 (six) hours as needed.     ondansetron 4 MG tablet  Commonly known as:  ZOFRAN  Take 4 mg by mouth every 8 (eight) hours as needed for nausea or vomiting.     oxyCODONE-acetaminophen 5-325 MG per tablet  Commonly known as:  PERCOCET/ROXICET  Take 2 tablets by mouth every 4 (four) hours as needed for moderate pain.     oxyCODONE-acetaminophen 10-325 MG per tablet  Commonly known as:  PERCOCET  Take 1 tablet by mouth every 4 (four) hours as needed (severe pain).     polyethylene glycol packet  Commonly known as:  MIRALAX / GLYCOLAX  Take 17 g by mouth daily.     pregabalin 150 MG capsule  Commonly known as:  LYRICA  Take 150 mg by mouth 3 (three) times daily.         Signed: Karn Cassis 04/02/2013, 5:49 PM

## 2013-04-02 NOTE — Transfer of Care (Signed)
Immediate Anesthesia Transfer of Care Note  Patient: Sherri Copeland  Procedure(s) Performed: Procedure(s) with comments: RIGHT LUMBAR FIVE TO SACRAL ONE LUMBAR LAMINECTOMY/DECOMPRESSION MICRODISCECTOMY 1 LEVEL (Right) - Right L5-S1 Diskectomy  Patient Location: PACU  Anesthesia Type:General  Level of Consciousness: sedated and patient cooperative  Airway & Oxygen Therapy: Patient Spontanous Breathing and Patient connected to face mask oxygen  Post-op Assessment: Report given to PACU RN and Post -op Vital signs reviewed and stable  Post vital signs: Reviewed and stable  Complications: No apparent anesthesia complications

## 2013-04-02 NOTE — Op Note (Signed)
NAME:  Sherri Copeland, TRENT NO.:  [id number]  MEDICAL RECORD NO.:  [id number]  LOCATION:  3C07C                        FACILITY:  [facility]  PHYSICIAN:  Hilda Lias, M.D.   DATE OF BIRTH:  07/16/1975  DATE OF PROCEDURE:  04/02/2013 DATE OF DISCHARGE:  04/02/2013                              OPERATIVE REPORT   PREOPERATIVE DIAGNOSIS:  Right L5-S1 herniated disk with a large fragment with displacement of the thecal sac.  POSTOPERATIVE DIAGNOSIS:  Right L5-S1 herniated disk with a large fragment with displacement of the thecal sac.  PROCEDURE:  Right L5-S1 laminotomy, foraminotomy, removal of 3 large free fragments, diskectomy,  microscope.  SURGEON:  Hilda Lias, MD  ASSISTANT:  Dr. Bo Mcclintock.  CLINICAL HISTORY:  [name] is a lady who was seen in the office because of a large herniated disk going to the right side.  She had failed with conservative treatment.  Myelogram showed that she has a large herniated disk with displacement of the thecal sac going into the foramen.  The patient had severe weakness of the right foot.  Surgery was advised.  The risks were fully explained to her and her husband.  DESCRIPTION OF PROCEDURE:  The patient was taken to the OR, and after intubation, she was positioned in a prone manner.  The back was cleaned with DuraPrep and Betadine.  Drapes were applied.  The patient has a large tattoo and she wants [place] to avoid coming through tattoo.  So decision was made a little above the L5 after we selected the area with an x-ray.  The incision was carried out all the way down through the skin, subcutaneous tissue, adipose tissue into the fascia.  The fascia was retracted laterally.  A second x-ray showed that indeed we were right at the level of L5.  From then on with the help of the microscope, we did a laminotomy of L5-S1 with removal of the yellow ligament. Indeed we found that the S1 nerve root was swollen and reddish with quite a  bit of adhesions.  Lysis was accomplished and we were able to remove 3 large free fragments that were right at the axilla of the nerve.  Then, we investigated the disk space.  There was opening and we entered the disk space and total gross diskectomy was achieved medially and laterally.  At the end, we had a good decompression of the L5 and S1 nerve root as well as the thecal sac. Valsalva maneuver was negative.  Fentanyl and Depo-Medrol were left in the epidural space, and the wound was closed with Vicryl and Steri- Strips.          ______________________________ Hilda Lias, M.D.     EB/MEDQ  D:  04/02/2013  T:  04/02/2013  Job:  [id number]

## 2013-04-02 NOTE — Evaluation (Signed)
Physical Therapy Evaluation Patient Details Name: Sherri Copeland MRN: [id number] DOB: 1975/06/20 Today's Date: 04/02/2013 Time: 1700-1720 PT Time Calculation (min): 20 min  PT Assessment / Plan / Recommendation History of Present Illness  37 y.o. s/p RIGHT LUMBAR FIVE TO SACRAL ONE LUMBAR LAMINECTOMY/DECOMPRESSION MICRODISCECTOMY 1 LEVEL (Right)  Clinical Impression  Patient is s/p lumbar surgery.  All education completed.  Pt and husband with no further questions for PT at this time.       PT Assessment  Patent does not need any further PT services    Follow Up Recommendations  No PT follow up;Supervision for mobility/OOB    Does the patient have the potential to tolerate intense rehabilitation      Barriers to Discharge        Equipment Recommendations  Rolling walker with 5" wheels    Recommendations for Other Services     Frequency      Precautions / Restrictions Precautions Precautions: Back Precaution Booklet Issued: Yes (comment) Precaution Comments: Reviewed precautions with pt and spouse present   Pertinent Vitals/Pain 2/10 low back pain. Per pt she had pain meds recently.     Mobility  Bed Mobility Bed Mobility: Rolling Right;Right Sidelying to Sit;Sit to Sidelying Right Rolling Right: 5: Supervision Rolling Left: 4: Min guard Right Sidelying to Sit: 5: Supervision Sitting - Scoot to Edge of Bed: 5: Supervision Sit to Sidelying Right: 5: Supervision Details for Bed Mobility Assistance: Cues for log roll technique. Transfers Transfers: Sit to Stand;Stand to Sit Sit to Stand: With upper extremity assist;From bed;5: Supervision Stand to Sit: 4: Min guard;To bed Details for Transfer Assistance: Cues for hand placement. Ambulation/Gait Ambulation/Gait Assistance: 5: Supervision Ambulation Distance (Feet): 180 Feet Assistive device: Rolling walker Gait Pattern: Step-through pattern;Decreased stride length Gait velocity: decreased Stairs: Yes Stairs  Assistance: 4: Min assist Stairs Assistance Details (indicate cue type and reason): instructional cues for technique Stair Management Technique: One rail Left;Forwards;Step to pattern Number of Stairs: 4    Exercises     PT Diagnosis:    PT Problem List:   PT Treatment Interventions:       PT Goals(Current goals can be found in the care plan section) Acute Rehab PT Goals Patient Stated Goal: to practice stairs  Visit Information  Last PT Received On: 04/02/13 Assistance Needed: +1 History of Present Illness: 37 y.o. s/p RIGHT LUMBAR FIVE TO SACRAL ONE LUMBAR LAMINECTOMY/DECOMPRESSION MICRODISCECTOMY 1 LEVEL (Right)       Prior Functioning  Home Living Family/patient expects to be discharged to:: Private residence Living Arrangements: Spouse/significant other;Children Available Help at Discharge: Family;Available 24 hours/day Type of Home: House Home Access: Stairs to enter [employer] of Steps: 2 Entrance Stairs-Rails: None Home Layout: Two level;Able to live on main level with bedroom/bathroom Alternate Level Stairs-Number of Steps: flight Alternate Level Stairs-Rails: Left;Right (portion without rail) Home Equipment: Wheelchair - manual;Hand held shower head Prior Function Level of Independence: Needs assistance Gait / Transfers Assistance Needed: needed assistance with ambulation and transfers due to severe back pain ADL's / Homemaking Assistance Needed: assistance with dressing Communication Communication: No difficulties Dominant Hand: Right    Cognition  Cognition Arousal/Alertness: Awake/alert Behavior During Therapy: WFL for tasks assessed/performed Overall Cognitive Status: Within Functional Limits for tasks assessed    Extremity/Trunk Assessment Upper Extremity Assessment Upper Extremity Assessment: Overall WFL for tasks assessed Lower Extremity Assessment Lower Extremity Assessment: Overall WFL for tasks assessed (able to fully suport self in  single leg stance) Cervical / Trunk Assessment  Cervical / Trunk Assessment: Normal   Balance    End of Session PT - End of Session Equipment Utilized During Treatment: Gait belt Activity Tolerance: Patient tolerated treatment well Patient left: in bed;with family/visitor present Nurse Communication: Mobility status;Other (comment) (No further PT needs)  GP     Donnella Sham 04/02/2013, 5:31 PM Lavona Mound, PT  [phone] 04/02/2013

## 2013-04-02 NOTE — Anesthesia Preprocedure Evaluation (Signed)
Anesthesia Evaluation  Patient identified by MRN, date of birth, ID band Patient awake    Reviewed: Allergy & Precautions, H&P , NPO status , Patient's Chart, lab work & pertinent test results, reviewed documented beta blocker date and time   History of Anesthesia Complications (+) PONV and history of anesthetic complications  Airway Mallampati: II TM Distance: >3 FB Neck ROM: full    Dental   Pulmonary pneumonia -, resolved,  breath sounds clear to auscultation        Cardiovascular negative cardio ROS  Rhythm:regular     Neuro/Psych  Headaches, negative psych ROS   GI/Hepatic negative GI ROS, Neg liver ROS,   Endo/Other  negative endocrine ROS  Renal/GU negative Renal ROS  negative genitourinary   Musculoskeletal   Abdominal   Peds  Hematology negative hematology ROS (+)   Anesthesia Other Findings See surgeon's H&P   Reproductive/Obstetrics negative OB ROS                           Anesthesia Physical Anesthesia Plan  ASA: II  Anesthesia Plan: General   Post-op Pain Management:    Induction: Intravenous  Airway Management Planned: Oral ETT  Additional Equipment:   Intra-op Plan:   Post-operative Plan: Extubation in OR  Informed Consent: I have reviewed the patients History and Physical, chart, labs and discussed the procedure including the risks, benefits and alternatives for the proposed anesthesia with the patient or authorized representative who has indicated his/her understanding and acceptance.   Dental Advisory Given  Plan Discussed with: CRNA and Surgeon  Anesthesia Plan Comments:         Anesthesia Quick Evaluation

## 2013-04-08 ENCOUNTER — Encounter (HOSPITAL_COMMUNITY): Payer: Self-pay | Admitting: Neurosurgery

## 2013-05-05 ENCOUNTER — Ambulatory Visit (HOSPITAL_COMMUNITY)
Admission: RE | Admit: 2013-05-05 | Discharge: 2013-05-05 | Disposition: A | Discharge status: Home / Self Care | Payer: Managed Care, Other (non HMO) | Source: Ambulatory Visit | Attending: Neurosurgery | Admitting: Neurosurgery

## 2013-05-05 ENCOUNTER — Other Ambulatory Visit (HOSPITAL_COMMUNITY): Payer: Self-pay | Admitting: Neurosurgery

## 2013-05-05 DIAGNOSIS — R609 Edema, unspecified: Secondary | ICD-10-CM | POA: Insufficient documentation

## 2013-05-05 DIAGNOSIS — M7989 Other specified soft tissue disorders: Secondary | ICD-10-CM

## 2013-05-05 DIAGNOSIS — M79609 Pain in unspecified limb: Secondary | ICD-10-CM | POA: Insufficient documentation

## 2013-05-05 NOTE — Progress Notes (Signed)
VASCULAR LAB PRELIMINARY  PRELIMINARY  PRELIMINARY  PRELIMINARY   Right lower extremity venous duplex completed.    Preliminary report:  Right leg is negative for deep and superficial vein thrombosis.  Diontay Rosencrans, RVT 05/05/2013, 6:14 PM

## 2013-05-06 ENCOUNTER — Ambulatory Visit (HOSPITAL_COMMUNITY): Payer: Managed Care, Other (non HMO)

## 2013-05-14 ENCOUNTER — Encounter: Payer: Self-pay | Admitting: Family Medicine

## 2013-05-14 ENCOUNTER — Ambulatory Visit (INDEPENDENT_AMBULATORY_CARE_PROVIDER_SITE_OTHER): Payer: Managed Care, Other (non HMO) | Admitting: Family Medicine

## 2013-05-14 VITALS — BP 119/82 | HR 96 | Temp 99.2°F | Resp 18 | Ht 66.5 in | Wt 165.0 lb

## 2013-05-14 DIAGNOSIS — T753XXA Motion sickness, initial encounter: Secondary | ICD-10-CM

## 2013-05-14 DIAGNOSIS — Z23 Encounter for immunization: Secondary | ICD-10-CM

## 2013-05-14 MED ORDER — SCOPOLAMINE 1 MG/3DAYS TD PT72
1.0000 | MEDICATED_PATCH | TRANSDERMAL | Status: DC
Start: 2013-05-14 — End: 2013-09-23

## 2013-05-14 NOTE — Progress Notes (Signed)
Pre visit review using our clinic review tool, if applicable. No additional management support is needed unless otherwise documented below in the visit note. 

## 2013-05-14 NOTE — Progress Notes (Signed)
OFFICE NOTE  05/14/2013  CC:  Chief Complaint  Patient presents with  . Travel Consult    motion sickness medication for cruise     HPI: Patient is a 38 y.o. Caucasian female who is here for meds for a cruise she is going on soon. Gets seasick, also sick with plane flights.  Will be gone for 5d.  Transderm scop has helped in the past, although it does cause her some blurry vision.   Pertinent PMH:  Past Medical History  Diagnosis Date  . PONV (postoperative nausea and vomiting)   . Hx of cholecystectomy 2004  . PCOS (polycystic ovarian syndrome)     Dr. Elvera LennoxGherghe  . Endometriosis   . Lower back pain 2014    Injection 03/14/13.  ED visit 03/15/13.  . Pneumonia   . Headache(784.0)     Migraines  . H/O irritable bowel syndrome    Past surgical, social, and family history reviewed and no changes noted since last office visit.  MEDS:  Outpatient Prescriptions Prior to Visit  Medication Sig Dispense Refill  . diazepam (VALIUM) 10 MG tablet Take 10 mg by mouth every 6 (six) hours as needed for anxiety (and muscle relaxer).      Marland Kitchen. ibuprofen (ADVIL,MOTRIN) 200 MG tablet Take 200 mg by mouth every 6 (six) hours as needed.      . ondansetron (ZOFRAN) 4 MG tablet Take 4 mg by mouth every 8 (eight) hours as needed for nausea or vomiting.      Marland Kitchen. oxyCODONE-acetaminophen (PERCOCET) 10-325 MG per tablet Take 1 tablet by mouth every 4 (four) hours as needed (severe pain).      Marland Kitchen. oxyCODONE-acetaminophen (PERCOCET/ROXICET) 5-325 MG per tablet Take 2 tablets by mouth every 4 (four) hours as needed for moderate pain.       . polyethylene glycol (MIRALAX / GLYCOLAX) packet Take 17 g by mouth daily.       No facility-administered medications prior to visit.    PE: Blood pressure 119/82, pulse 96, temperature 99.2 F (37.3 C), temperature source Temporal, resp. rate 18, height 5' 6.5" (1.689 m), weight 165 lb (74.844 kg), SpO2 99.00%. Gen: Alert, well appearing.  Patient is oriented to person,  place, time, and situation. No further exam today.  IMPRESSION AND PLAN:  Travel sickness history. Transdermal scopolamine patches 1.5mg , 1 behind ear q72h, box of 4, RF x 1.  Flu vaccine IM today.  An After Visit Summary was printed and given to the patient.  FOLLOW UP: prn

## 2013-07-02 ENCOUNTER — Other Ambulatory Visit: Payer: Self-pay | Admitting: Obstetrics and Gynecology

## 2013-09-15 ENCOUNTER — Ambulatory Visit (INDEPENDENT_AMBULATORY_CARE_PROVIDER_SITE_OTHER): Payer: Managed Care, Other (non HMO) | Admitting: Nurse Practitioner

## 2013-09-15 ENCOUNTER — Encounter: Payer: Self-pay | Admitting: Nurse Practitioner

## 2013-09-15 VITALS — BP 112/77 | HR 82 | Temp 98.6°F | Ht 66.5 in | Wt 164.0 lb

## 2013-09-15 DIAGNOSIS — R350 Frequency of micturition: Secondary | ICD-10-CM

## 2013-09-15 DIAGNOSIS — R3915 Urgency of urination: Secondary | ICD-10-CM

## 2013-09-15 LAB — POCT URINALYSIS DIPSTICK
Bilirubin, UA: 1.025
Blood, UA: NEGATIVE
COLOR UA: NEGATIVE
KETONES UA: NEGATIVE
LEUKOCYTES UA: NEGATIVE
Nitrite, UA: NEGATIVE
Protein, UA: NEGATIVE
Spec Grav, UA: 1.025
Urobilinogen, UA: 0.2
pH, UA: 7

## 2013-09-15 MED ORDER — PHENAZOPYRIDINE HCL 200 MG PO TABS: 200.0000 mg | ORAL_TABLET | Freq: Three times a day (TID) | ORAL | Status: AC | PRN

## 2013-09-15 NOTE — Progress Notes (Signed)
   Subjective:    Patient ID: Sherri Copeland, female    DOB: 09-Apr-1976, 38 y.o.   MRN: [id number]  Urinary Tract Infection  This is a new problem. The current episode started in the past 7 days (6d). The problem occurs every urination (had dysuria-now resolved. still having frequency & urgency). The problem has been gradually improving. The patient is experiencing no pain (has chronic back pain & mild suprapubic doscomfort since hysterectomy few mos ago. No new pain r/t present complaint.). There has been no fever. She is sexually active. There is no history of pyelonephritis. Associated symptoms include frequency and urgency. Pertinent negatives include no chills, discharge, flank pain, hematuria, hesitancy, nausea, possible pregnancy or vomiting. Treatments tried: called [employer] thru employer-started 7 day course macrobid. Has 1 day left. AZO, cranberry juice. The treatment provided mild (only dysuria resolved) relief. hysterectomy few mos ago      Review of Systems  Constitutional: Negative for fever, chills, activity change, appetite change and fatigue.  Respiratory: Negative for cough.   Gastrointestinal: Negative for nausea, vomiting and abdominal pain.  Genitourinary: Positive for dysuria, urgency and frequency. Negative for hesitancy, hematuria, flank pain, decreased urine volume, difficulty urinating and vaginal pain.  Musculoskeletal: Positive for back pain (chronic lumbar, no flank pain).       Objective:   Physical Exam  Vitals reviewed. Constitutional: She is oriented to person, place, and time. She appears well-developed and well-nourished. No distress.  HENT:  Head: Normocephalic and atraumatic.  Eyes: Conjunctivae are normal. Right eye exhibits no discharge. Left eye exhibits no discharge.  Cardiovascular: Normal rate.   Pulmonary/Chest: Effort normal. No respiratory distress.  Abdominal: Soft. She exhibits no distension and no mass. There is no tenderness. There is no  rebound and no guarding.  Musculoskeletal: She exhibits no tenderness (no cva tenderness).  Neurological: She is alert and oriented to person, place, and time.  Skin: Skin is warm and dry.  Psychiatric: She has a normal mood and affect. Her behavior is normal. Thought content normal.          Assessment & Plan:  1. Frequency of urination Day 6 of macrobid - POCT urinalysis dipstick-sm bili & tr ketones. SG 1.025 - phenazopyridine (PYRIDIUM) 200 MG tablet; Take 1 tablet (200 mg total) by mouth 3 (three) times daily as needed for pain.  Dispense: 6 tablet; Refill: 0 - Urine culture-pending  2. Urgency of urination

## 2013-09-15 NOTE — Patient Instructions (Signed)
Continue macrobid. I will send urine for culture and call if you need a different antibiotic. Take pyridium to relax bladder, caution: urine tears & sweat will be orange. Do not be alarmed! Sip hydrating fluids (water, juice, colorless soda, decaff tea) every hour to flush kidneys. You may add Vitamin C twice daily 500 mg to acidify urine & discourage bacterial growth. Let us know if symptoms are not improving.    Urinary Tract Infection Urinary tract infections (UTIs) can develop anywhere along your urinary tract. Your urinary tract is your body's drainage system for removing wastes and extra water. Your urinary tract includes two kidneys, two ureters, a bladder, and a urethra. Your kidneys are a pair of bean-shaped organs. Each kidney is about the size of your fist. They are located below your ribs, one on each side of your spine. CAUSES Infections are caused by microbes, which are microscopic organisms, including fungi, viruses, and bacteria. These organisms are so small that they can only be seen through a microscope. Bacteria are the microbes that most commonly cause UTIs. SYMPTOMS  Symptoms of UTIs may vary by age and gender of the patient and by the location of the infection. Symptoms in young women typically include a frequent and intense urge to urinate and a painful, burning feeling in the bladder or urethra during urination. Older women and men are more likely to be tired, shaky, and weak and have muscle aches and abdominal pain. A fever may mean the infection is in your kidneys. Other symptoms of a kidney infection include pain in your back or sides below the ribs, nausea, and vomiting. DIAGNOSIS To diagnose a UTI, your caregiver will ask you about your symptoms. Your caregiver also will ask to provide a urine sample. The urine sample will be tested for bacteria and white blood cells. White blood cells are made by your body to help fight infection. TREATMENT  Typically, UTIs can be treated  with medication. Because most UTIs are caused by a bacterial infection, they usually can be treated with the use of antibiotics. The choice of antibiotic and length of treatment depend on your symptoms and the type of bacteria causing your infection. HOME CARE INSTRUCTIONS  If you were prescribed antibiotics, take them exactly as your caregiver instructs you. Finish the medication even if you feel better after you have only taken some of the medication.  Drink enough water and fluids to keep your urine clear or pale yellow.  Avoid caffeine, tea, and carbonated beverages. They tend to irritate your bladder.  Empty your bladder often. Avoid holding urine for long periods of time.  Empty your bladder before and after sexual intercourse.  After a bowel movement, women should cleanse from front to back. Use each tissue only once. SEEK MEDICAL CARE IF:   You have back pain.  You develop a fever.  Your symptoms do not begin to resolve within 3 days. SEEK IMMEDIATE MEDICAL CARE IF:   You have severe back pain or lower abdominal pain.  You develop chills.  You have nausea or vomiting.  You have continued burning or discomfort with urination. MAKE SURE YOU:   Understand these instructions.  Will watch your condition.  Will get help right away if you are not doing well or get worse. Document Released: 02/01/2005 Document Revised: 10/24/2011 Document Reviewed: 06/02/2011 [facility] Patient Information 2014 [employer], [place].

## 2013-09-15 NOTE — Progress Notes (Signed)
Pre visit review using our clinic review tool, if applicable. No additional management support is needed unless otherwise documented below in the visit note. 

## 2013-09-16 LAB — URINE CULTURE
COLONY COUNT: NO GROWTH
ORGANISM ID, BACTERIA: NO GROWTH

## 2013-09-23 ENCOUNTER — Other Ambulatory Visit: Payer: Self-pay | Admitting: Family Medicine

## 2013-09-23 NOTE — Telephone Encounter (Signed)
Refill request for scopolamine Last filled by MD on - 05/14/2013 #4 x1 Last Appt: 05/14/2013 Next Appt: none Please advise refill?

## 2014-03-09 ENCOUNTER — Encounter: Payer: Self-pay | Admitting: Nurse Practitioner

## 2014-03-09 IMAGING — DX DG LUMBAR SPINE 2-3V
1 series · 1 of 1 positions shown · non-contrast
Comparison: 03/25/2013

CLINICAL DATA: L5-S1 discectomy

EXAM:
LUMBAR SPINE - 2-3 VIEW

[lat]
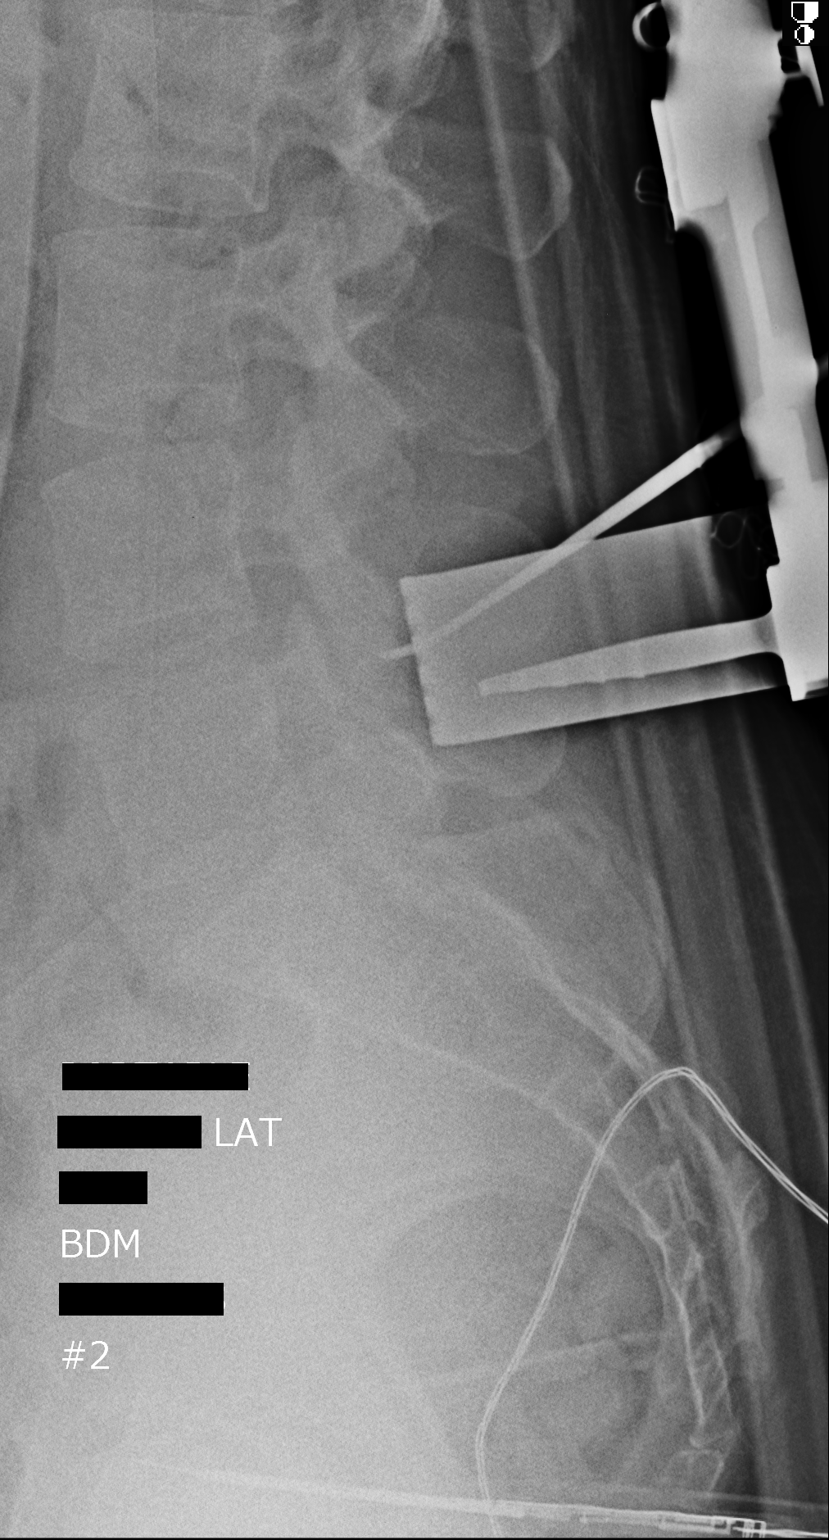

[1 of 1 positions shown; findings below may reference images not displayed]

FINDINGS: The lumbar vertebra are numbered in the fashion used on the prior CT
myelogram. A needle is noted within the posterior soft tissues at
the L5-S1 level on the initial image. Subsequent images demonstrate
a retractor in this areas well surgical instruments posterior to the
L5 posterior elements.

## 2015-03-23 ENCOUNTER — Ambulatory Visit
Admit: 2015-03-23 | Discharge: 2015-03-23 | Payer: PRIVATE HEALTH INSURANCE | Attending: Internal Medicine | Primary: Internal Medicine

## 2015-03-23 DIAGNOSIS — L84 Corns and callosities: Secondary | ICD-10-CM

## 2015-03-23 NOTE — Progress Notes (Signed)
HISTORY OF PRESENT ILLNESS  Latoya Cole is a 39 y.o. female.  HPI  She has PCOS - the only way she has lost weight was seeing endocrine ; she stays on phentermine which has helped   She has taken metformin in the past but not right now   She does exercise - she has a gym at work and goes after work -40 minutes on elliptical - she gets home around 6..     She has a spot on bottom of foot that has not helped with OTC medicine- hurts and thinks when she had hurt her back and had gait issues it put more pressure on one foot causing the callus   Review of Systems   Constitutional: Negative.  Negative for chills, diaphoresis, fever, malaise/fatigue and weight loss.   HENT: Negative for congestion, nosebleeds and tinnitus.    Eyes: Negative for blurred vision, double vision and photophobia.   Respiratory: Negative for cough, hemoptysis, sputum production, shortness of breath and wheezing.    Cardiovascular: Negative for chest pain, palpitations, orthopnea, claudication, leg swelling and PND.   Gastrointestinal: Negative for abdominal pain, blood in stool, constipation, diarrhea, heartburn, melena, nausea and vomiting.   Genitourinary: Negative for dysuria, frequency, hematuria and urgency.   Musculoskeletal: Negative for back pain, joint pain, myalgias and neck pain.   Skin: Negative for itching and rash.   Neurological: Negative for dizziness, tingling, sensory change, speech change, focal weakness, weakness and headaches.   Endo/Heme/Allergies: Negative for polydipsia. Does not bruise/bleed easily.   Psychiatric/Behavioral: Negative for depression. The patient is not nervous/anxious and does not have insomnia.        Physical Exam   Constitutional: She is oriented to person, place, and time. She appears well-developed and well-nourished.   HENT:   Head: Normocephalic and atraumatic.   Right Ear: External ear normal.   Left Ear: External ear normal.   Nose: Nose normal.    Mouth/Throat: Oropharynx is clear and moist.   Neck: Normal range of motion. Neck supple. No JVD present. Carotid bruit is not present. No thyroid mass and no thyromegaly present.   Cardiovascular: Normal rate, regular rhythm, S1 normal, S2 normal, normal heart sounds, intact distal pulses and normal pulses.  Exam reveals no gallop and no friction rub.    No murmur heard.  Pulmonary/Chest: Effort normal and breath sounds normal.   Abdominal: Soft. Bowel sounds are normal.   Musculoskeletal: Normal range of motion.   Neurological: She is alert and oriented to person, place, and time. She has normal strength.   Skin: Skin is warm and dry.   Callus deep and tender 2x2 cm on bottom of foot   Psychiatric: She has a normal mood and affect. Her behavior is normal. Judgment and thought content normal.   Nursing note and vitals reviewed.      ASSESSMENT and PLAN  Latoya Cole was seen today for establish care.    Diagnoses and all orders for this visit:    Pre-ulcerative corn or callous  -     REFERRAL TO PODIATRY    PCOs- cont with phenteremine with Dr.Sicat but there may be a point she stops; she does have a h/o ADD in the past and we do need to repeat testing but would be something willing to address down the road    Had Labs done with Dr.Sicat and at [facility]- she will get me records  lab results and schedule of future lab studies reviewed with patient  reviewed diet,  exercise and weight control  cardiovascular risk and specific lipid/LDL goals reviewed  reviewed medications and side effects in detail

## 2015-03-31 ENCOUNTER — Encounter: Attending: Internal Medicine | Primary: Internal Medicine

## 2017-06-18 ENCOUNTER — Ambulatory Visit
Admit: 2017-06-18 | Discharge: 2017-06-18 | Payer: PRIVATE HEALTH INSURANCE | Attending: Internal Medicine | Primary: Internal Medicine

## 2017-06-18 DIAGNOSIS — F419 Anxiety disorder, unspecified: Secondary | ICD-10-CM

## 2017-06-18 MED ORDER — BUPROPION XL 150 MG 24 HR TAB
150 mg | ORAL_TABLET | ORAL | 1 refills | Status: DC
Start: 2017-06-18 — End: 2017-07-24

## 2017-06-18 MED ORDER — PHENTERMINE 37.5 MG TAB
37.5 mg | ORAL_TABLET | Freq: Every day | ORAL | 1 refills | Status: DC
Start: 2017-06-18 — End: 2017-09-04

## 2017-06-18 NOTE — Progress Notes (Signed)
HISTORY OF PRESENT ILLNESS  Latoya Cole is a 42 y.o. female.  HPI  Weight Loss/Anxiety: Pt has been relying on food to cope with feeling anxious and overwhelmed. She has been close to having anxiety attacks. She continues on Phentermine (1 tablet/day) and Ashwagandha. Pt continues to f/u with Dr. Charlett BlakeSicat every 3 months. She endorses stable BP at those f/u's.     PCOS: Stable.       Review of Systems   All other systems reviewed and are negative.      Physical Exam   Constitutional: She is oriented to person, place, and time. She appears well-developed and well-nourished.   HENT:   Head: Normocephalic and atraumatic.   Right Ear: External ear normal.   Left Ear: External ear normal.   Nose: Nose normal.   Mouth/Throat: Oropharynx is clear and moist.   Eyes: Conjunctivae and EOM are normal.   Neck: Normal range of motion. Neck supple. Carotid bruit is not present. No thyroid mass and no thyromegaly present.   Cardiovascular: Normal rate, regular rhythm, S1 normal, S2 normal, normal heart sounds and intact distal pulses.   Pulmonary/Chest: Effort normal and breath sounds normal.   Abdominal: Soft. Normal appearance and bowel sounds are normal. There is no hepatosplenomegaly. There is no tenderness.   Musculoskeletal: Normal range of motion.   Neurological: She is alert and oriented to person, place, and time. She has normal strength. No cranial nerve deficit or sensory deficit. Coordination normal.   Skin: Skin is warm, dry and intact. No abrasion and no rash noted.   Psychiatric: She has a normal mood and affect. Her behavior is normal. Judgment and thought content normal.   Nursing note and vitals reviewed.      ASSESSMENT and PLAN  Diagnoses and all orders for this visit:    1. Anxiety  Prescribed Wellbutrin, to help with mood and weight. Pt will schedule 4-6-week f/u to reassess meds.   -     buPROPion XL (WELLBUTRIN XL) 150 mg tablet; Take 1 Tab by mouth every morning.    2. Weight loss   Stable, and well-managed with Phentermine and Ashwagandha.   -     phentermine (ADIPEX-P) 37.5 mg tablet; Take 1 Tab by mouth daily. Max Daily Amount: 37.5 mg.    3. PCOS (polycystic ovarian syndrome)  Stable, and well-managed. No change in medications.       Lab results and schedule of future lab studies reviewed with patient.  Reviewed diet, exercise and weight control.    Written by Georgia Domheryne Kim, ScribeKick, as dictated by Tora KindredSonia Shah-Pandya, MD.     Current diagnosis and concerns discussed with pt at length. Understands risks and benefits or current treatment plan and medications and accepts the treatment and medication with any possible risks. Pt asks appropriate questions which were answered. Pt instructed to call with any concerns or problems.    This note will not be viewable in MyChart.

## 2017-07-24 ENCOUNTER — Ambulatory Visit
Admit: 2017-07-24 | Discharge: 2017-07-24 | Payer: PRIVATE HEALTH INSURANCE | Attending: Internal Medicine | Primary: Internal Medicine

## 2017-07-24 DIAGNOSIS — F419 Anxiety disorder, unspecified: Secondary | ICD-10-CM

## 2017-07-24 MED ORDER — BUPROPION XL 300 MG 24 HR TAB
300 mg | ORAL_TABLET | ORAL | 5 refills | Status: DC
Start: 2017-07-24 — End: 2018-02-26

## 2017-07-24 NOTE — Progress Notes (Signed)
HISTORY OF PRESENT ILLNESS  Latoya Cole is a 42 y.o. female.  HPI  Mood: Stable, and well-managed with Wellbutrin. Pt reports improvement in mood with Wellbutrin but observes days when more Wellbutrin is needed. Denies feelings of depression.     Weight: Pt drinks shakes for breakfast and incorporates protein into diet. She doesn't exercise regularly. She continues on Phentermine. She needs to schedule f/u with Dr. Charlett BlakeSicat.         Review of Systems   All other systems reviewed and are negative.      Physical Exam   Constitutional: She is oriented to person, place, and time. She appears well-developed and well-nourished.   HENT:   Head: Normocephalic and atraumatic.   Right Ear: External ear normal.   Left Ear: External ear normal.   Nose: Nose normal.   Mouth/Throat: Oropharynx is clear and moist.   Eyes: Conjunctivae and EOM are normal.   Neck: Normal range of motion. Neck supple. Carotid bruit is not present. No thyroid mass and no thyromegaly present.   Cardiovascular: Normal rate, regular rhythm, S1 normal, S2 normal, normal heart sounds and intact distal pulses.   Pulmonary/Chest: Effort normal and breath sounds normal.   Abdominal: Soft. Normal appearance and bowel sounds are normal. There is no hepatosplenomegaly. There is no tenderness.   Musculoskeletal: Normal range of motion.   Neurological: She is alert and oriented to person, place, and time. She has normal strength. No cranial nerve deficit or sensory deficit. Coordination normal.   Skin: Skin is warm, dry and intact. No abrasion and no rash noted.   Psychiatric: She has a normal mood and affect. Her behavior is normal. Judgment and thought content normal.   Nursing note and vitals reviewed.      ASSESSMENT and PLAN  Diagnoses and all orders for this visit:    1. Anxiety  Increased Wellbutrin dosage to 300mg /day. Encouraged pt to exercise regularly to manage mood and weight.    -     buPROPion XL (WELLBUTRIN XL) 300 mg XL tablet; Take 1 Tab by mouth every morning.    Additional Comments: Pt will schedule f/u with Dr. Charlett BlakeSicat.     Over 50% of the 25 minutes face to face with Latoya Cole consisted of counseling and/or discussing treatment plans in reference to her anxiety and working on exercise .            Lab results and schedule of future lab studies reviewed with patient.  Reviewed diet, exercise and weight control.    Written by Georgia Domheryne Kim, ScribeKick, as dictated by Tora KindredSonia Shah-Pandya, MD.     Current diagnosis and concerns discussed with pt at length. Understands risks and benefits or current treatment plan and medications and accepts the treatment and medication with any possible risks. Pt asks appropriate questions which were answered. Pt instructed to call with any concerns or problems.    This note will not be viewable in MyChart.

## 2017-09-04 ENCOUNTER — Encounter

## 2017-09-04 MED ORDER — PHENTERMINE 37.5 MG TAB
37.5 mg | ORAL_TABLET | Freq: Every day | ORAL | 0 refills | Status: AC
Start: 2017-09-04 — End: 2017-10-04

## 2017-09-04 NOTE — Telephone Encounter (Signed)
Regarding: Prescription Question  Contact: [phone]  ----- Message from Mychart, Generic sent at 09/03/2017 12:05 PM EDT -----    Could I please have a refill on Phentermine?  Thanks!

## 2017-10-18 ENCOUNTER — Encounter

## 2017-10-24 ENCOUNTER — Encounter: Attending: Internal Medicine | Primary: Internal Medicine

## 2017-10-25 NOTE — Telephone Encounter (Addendum)
Patient states she is now completely out of medic ationphentermine (ADIPEX-P) 37.5 mg tablet , request was sent in 06/13            apologized to patient.

## 2017-11-02 NOTE — Telephone Encounter (Signed)
-----   Message from Phoebe Sharpsobin R Davis sent at 11/02/2017 12:35 PM EDT -----  Regarding: Dr. Sherryll BurgerShah/ Telephone   Pt is requesting a call back to follow up on the status or refill that was requested 2 weeks ago. "Phentermine". Pt's best contact is [phone].

## 2017-12-11 ENCOUNTER — Telehealth

## 2017-12-11 MED ORDER — PHENTERMINE 37.5 MG TAB
37.5 mg | ORAL_TABLET | ORAL | 0 refills | Status: DC
Start: 2017-12-11 — End: 2018-02-26

## 2017-12-11 NOTE — Telephone Encounter (Signed)
Please call in

## 2018-02-26 ENCOUNTER — Ambulatory Visit
Admit: 2018-02-26 | Discharge: 2018-02-26 | Payer: PRIVATE HEALTH INSURANCE | Attending: Internal Medicine | Primary: Internal Medicine

## 2018-02-26 ENCOUNTER — Ambulatory Visit: Attending: Internal Medicine | Primary: Internal Medicine

## 2018-02-26 DIAGNOSIS — R634 Abnormal weight loss: Secondary | ICD-10-CM

## 2018-02-26 MED ORDER — PHENTERMINE 37.5 MG TAB
37.5 mg | ORAL_TABLET | ORAL | 3 refills | Status: DC
Start: 2018-02-26 — End: 2018-07-09

## 2018-02-26 NOTE — Progress Notes (Signed)
HISTORY OF PRESENT ILLNESS  Pamela Fuentes is a 42 y.o. female.  HPI  Pt notes side effects of over medication of Wellbutrin 300 mg.     Mood: Pt reports that 300 mg of Wellbutrin was too much, and 150 mg was a good dosage. She discontinued the medication due to reduced outside stress. She is currently not on meds- and would like to call in if she thinks she needs to start them in the future.     ADD: Pt reports that she has come to the conclusion she has ADD> She notes fhx of ADD ( brothers, sister, mother). She would like to get a small rx of medication until she gets tested for ADD.    Weight: Pt reports that her food choices have not been good. She is active- but denies formal exercise.     Review of Systems   All other systems reviewed and are negative.      Physical Exam   Constitutional: She is oriented to person, place, and time. She appears well-developed and well-nourished.   HENT:   Head: Normocephalic and atraumatic.   Right Ear: External ear normal.   Left Ear: External ear normal.   Nose: Nose normal.   Mouth/Throat: Oropharynx is clear and moist.   Eyes: Pupils are equal, round, and reactive to light. Conjunctivae and EOM are normal.   Neck: Normal range of motion. Neck supple.   Cardiovascular: Normal rate, regular rhythm, normal heart sounds and intact distal pulses.   Pulmonary/Chest: Effort normal and breath sounds normal. Right breast exhibits no inverted nipple, no mass, no nipple discharge, no skin change and no tenderness. Left breast exhibits no inverted nipple, no mass, no nipple discharge, no skin change and no tenderness. No breast swelling, tenderness, discharge or bleeding. Breasts are symmetrical.   Abdominal: Soft. Bowel sounds are normal.   Genitourinary: Rectum normal and vagina normal. Rectal exam shows anal tone normal and guaiac negative stool. No breast swelling, tenderness, discharge or bleeding.   Musculoskeletal: Normal range of motion.   Neurological: She is alert and  oriented to person, place, and time.   Skin: Skin is warm and dry.   Psychiatric: She has a normal mood and affect. Her behavior is normal. Judgment and thought content normal.   Nursing note and vitals reviewed.      ASSESSMENT and PLAN  Diagnoses and all orders for this visit:    1. Weight loss  Prescribed Phentermine to hold over until pt can get tested for ADD. Advised pt to get testing done for ADD (Dr. Jiles Garter at [facility]). Will continue to monitor for improvements or changes.  -     phentermine (ADIPEX-P) 37.5 mg tablet; Take 1 Tab by mouth every morning. Max Daily Amount: 37.5 mg.    2. Anxiety  Stable and well-managed with out medication. Will continue to monitor for improvements or changes.    Additional comments: Administered flu and TDAP injection today in office. Advised pt to take some Tylenol or Advil when she gets home, to help with soreness post injection.     Lab results and schedule of future lab studies reviewed with patient.  Reviewed diet, exercise and weight control.    Written by Lenor Coffin, ScribeKick, as dictated by Tora Kindred, MD.     Current diagnosis and concerns discussed with pt at length. Understands risks and benefits or current treatment plan and medications and accepts the treatment and medication with any possible risks. Pt asks  appropriate questions which were answered. Pt instructed to call with any concerns or problems.    This note will not be viewable in [facility].

## 2018-02-26 NOTE — Progress Notes (Signed)
HISTORY OF PRESENT ILLNESS  Latoya Cole is a 42 y.o. female.  HPI  Pt notes side effects of over medication of Wellbutrin 300 mg.     Mood: Pt reports that 300 mg of Wellbutrin was too much, and 150 mg was a good dosage. She discontinued the medication due to reduced outside stress. She is currently not on meds- and would like to call in if she thinks she needs to start them in the future.     ADD: Pt reports that she has come to the conclusion she has ADD> She notes fhx of ADD ( brothers, sister, mother). She would like to get a small rx of medication until she gets tested for ADD.    Weight: Pt reports that her food choices have not been good. She is active- but denies formal exercise.     Review of Systems   All other systems reviewed and are negative.      Physical Exam   Constitutional: She is oriented to person, place, and time. She appears well-developed and well-nourished.   HENT:   Head: Normocephalic and atraumatic.   Right Ear: External ear normal.   Left Ear: External ear normal.   Nose: Nose normal.   Mouth/Throat: Oropharynx is clear and moist.   Eyes: Pupils are equal, round, and reactive to light. Conjunctivae and EOM are normal.   Neck: Normal range of motion. Neck supple.   Cardiovascular: Normal rate, regular rhythm, normal heart sounds and intact distal pulses.   Pulmonary/Chest: Effort normal and breath sounds normal. Right breast exhibits no inverted nipple, no mass, no nipple discharge, no skin change and no tenderness. Left breast exhibits no inverted nipple, no mass, no nipple discharge, no skin change and no tenderness. No breast swelling, tenderness, discharge or bleeding. Breasts are symmetrical.   Abdominal: Soft. Bowel sounds are normal.   Genitourinary: Rectum normal and vagina normal. Rectal exam shows anal tone normal and guaiac negative stool. No breast swelling, tenderness, discharge or bleeding.   Musculoskeletal: Normal range of motion.    Neurological: She is alert and oriented to person, place, and time.   Skin: Skin is warm and dry.   Psychiatric: She has a normal mood and affect. Her behavior is normal. Judgment and thought content normal.   Nursing note and vitals reviewed.      ASSESSMENT and PLAN  Diagnoses and all orders for this visit:    1. Weight loss  Prescribed Phentermine to hold over until pt can get tested for ADD. Advised pt to get testing done for ADD ([facility]. Jiles [facility]arter at [facility]). Will continue to monitor for improvements or changes.  -     phentermine (ADIPEX-P) 37.5 mg tablet; Take 1 Tab by mouth every morning. Max Daily Amount: 37.5 mg.    2. Anxiety  Stable and well-managed with out medication. Will continue to monitor for improvements or changes.    Additional comments: Administered flu and TDAP injection today in office. Advised pt to take some Tylenol or Advil when she gets home, to help with soreness post injection.     Lab results and schedule of future lab studies reviewed with patient.  Reviewed diet, exercise and weight control.    Written by Lenor Coffin, ScribeKick, as dictated by Tora Kindred, MD.     Current diagnosis and concerns discussed with pt at length. Understands risks and benefits or current treatment plan and medications and accepts the treatment and medication with any possible risks. Pt asks  appropriate questions which were answered. Pt instructed to call with any concerns or problems.    This note will not be viewable in [facility].

## 2018-07-09 ENCOUNTER — Ambulatory Visit
Admit: 2018-07-09 | Discharge: 2018-07-09 | Payer: PRIVATE HEALTH INSURANCE | Attending: Internal Medicine | Primary: Internal Medicine

## 2018-07-09 ENCOUNTER — Ambulatory Visit: Attending: Internal Medicine | Primary: Internal Medicine

## 2018-07-09 DIAGNOSIS — F419 Anxiety disorder, unspecified: Secondary | ICD-10-CM

## 2018-07-09 MED ORDER — PHENTERMINE 37.5 MG TAB
37.5 mg | ORAL_TABLET | ORAL | 3 refills | Status: DC
Start: 2018-07-09 — End: 2019-01-14

## 2018-07-09 MED ORDER — BUPROPION XL 150 MG 24 HR TAB
150 mg | ORAL_TABLET | ORAL | 1 refills | Status: DC
Start: 2018-07-09 — End: 2019-01-14

## 2018-07-09 NOTE — Progress Notes (Signed)
HISTORY OF PRESENT ILLNESS  Pamela Fuentes is a 43 y.o. female.  HPI  Mood: Pt reports that she stopped taking Wellbutrin, but would like to continue it. She notes work and at home stress. She is currently leaving her husband, and not open to talk about it. She is not currently doing counseling but would like too. Denies suicidal ideations.     Weight: Stable, pt continues to comply with Phentermine. She notes decrease in appetite while complying with Phentermine, and would like to continue on it for another round.     Review of Systems   Psychiatric/Behavioral: Positive for depression. Negative for suicidal ideas.   All other systems reviewed and are negative.      Physical Exam  Constitutional:       Appearance: Normal appearance.   HENT:      Right Ear: Hearing, tympanic membrane and external ear normal.      Left Ear: Hearing, tympanic membrane and external ear normal.      Mouth/Throat:      Mouth: Mucous membranes are moist.      Pharynx: Oropharynx is clear.   Cardiovascular:      Rate and Rhythm: Normal rate and regular rhythm.   Pulmonary:      Effort: Pulmonary effort is normal.      Breath sounds: Normal breath sounds and air entry.   Musculoskeletal: Normal range of motion.   Skin:     General: Skin is warm and dry.   Neurological:      General: No focal deficit present.      Mental Status: She is alert and oriented to person, place, and time.   Psychiatric:         Mood and Affect: Mood is depressed. Affect is tearful.         Behavior: Behavior normal.         ASSESSMENT and PLAN  Diagnoses and all orders for this visit:    1. Anxiety  Not stable. Strongly encouraged pt to see a counselor, she notes that she has one in her office she would like to see. Prescribed Wellbutrin 150 mg.   -     buPROPion XL (WELLBUTRIN XL) 150 mg tablet; Take 1 Tab by mouth every morning.    2. Weight loss  Stable and well-managed with Phentermine. No change in medications. Discussed with pt that Phentermine loses it's  effectiveness over time.   -     phentermine (ADIPEX-P) 37.5 mg tablet; Take 1 Tab by mouth every morning. Max Daily Amount: 37.5 mg.      Lab results and schedule of future lab studies reviewed with patient.  Reviewed diet, exercise and weight control.    Written by Lenor Coffin, ScribeKick, as dictated by Tora Kindred, MD.     Current diagnosis and concerns discussed with pt at length. Understands risks and benefits or current treatment plan and medications and accepts the treatment and medication with any possible risks. Pt asks appropriate questions which were answered. Pt instructed to call with any concerns or problems.    This note will not be viewable in MyChart.

## 2018-07-09 NOTE — Progress Notes (Signed)
HISTORY OF PRESENT ILLNESS  Latoya Cole is a 43 y.o. female.  HPI  Mood: Pt reports that she stopped taking Wellbutrin, but would like to continue it. She notes work and at home stress. She is currently leaving her husband, and not open to talk about it. She is not currently doing counseling but would like too. Denies suicidal ideations.     Weight: Stable, pt continues to comply with Phentermine. She notes decrease in appetite while complying with Phentermine, and would like to continue on it for another round.     Review of Systems   Psychiatric/Behavioral: Positive for depression. Negative for suicidal ideas.   All other systems reviewed and are negative.      Physical Exam  Constitutional:       Appearance: Normal appearance.   HENT:      Right Ear: Hearing, tympanic membrane and external ear normal.      Left Ear: Hearing, tympanic membrane and external ear normal.      Mouth/Throat:      Mouth: Mucous membranes are moist.      Pharynx: Oropharynx is clear.   Cardiovascular:      Rate and Rhythm: Normal rate and regular rhythm.   Pulmonary:      Effort: Pulmonary effort is normal.      Breath sounds: Normal breath sounds and air entry.   Musculoskeletal: Normal range of motion.   Skin:     General: Skin is warm and dry.   Neurological:      General: No focal deficit present.      Mental Status: She is alert and oriented to person, place, and time.   Psychiatric:         Mood and Affect: Mood is depressed. Affect is tearful.         Behavior: Behavior normal.         ASSESSMENT and PLAN  Diagnoses and all orders for this visit:    1. Anxiety  Not stable. Strongly encouraged pt to see a counselor, she notes that she has one in her office she would like to see. Prescribed Wellbutrin 150 mg.   -     buPROPion XL (WELLBUTRIN XL) 150 mg tablet; Take 1 Tab by mouth every morning.    2. Weight loss  Stable and well-managed with Phentermine. No change in medications.  Discussed with pt that Phentermine loses it's effectiveness over time.   -     phentermine (ADIPEX-P) 37.5 mg tablet; Take 1 Tab by mouth every morning. Max Daily Amount: 37.5 mg.      Lab results and schedule of future lab studies reviewed with patient.  Reviewed diet, exercise and weight control.    Written by Lenor Coffin, ScribeKick, as dictated by Tora Kindred, MD.     Current diagnosis and concerns discussed with pt at length. Understands risks and benefits or current treatment plan and medications and accepts the treatment and medication with any possible risks. Pt asks appropriate questions which were answered. Pt instructed to call with any concerns or problems.    This note will not be viewable in MyChart.

## 2019-01-14 ENCOUNTER — Ambulatory Visit: Admit: 2019-01-14 | Payer: BLUE CROSS/BLUE SHIELD | Attending: Internal Medicine | Primary: Internal Medicine

## 2019-01-14 ENCOUNTER — Ambulatory Visit: Attending: Internal Medicine | Primary: Internal Medicine

## 2019-01-14 DIAGNOSIS — F908 Attention-deficit hyperactivity disorder, other type: Secondary | ICD-10-CM

## 2019-01-14 MED ORDER — AMPHETAMINE-DEXTROAMPHETAMINE SR 10 MG 24 HR CAP
10 mg | ORAL_CAPSULE | ORAL | 0 refills | Status: DC
Start: 2019-01-14 — End: 2019-01-16

## 2019-01-14 NOTE — Progress Notes (Signed)
HISTORY OF PRESENT ILLNESS  WAYNE BRUNKER is a 43 y.o. female.  HPI  ADHD: Pt followed at [facility] and her overall cognitive functioning was measured. 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 Pt states that when she was in college, she struggled a lot which she believes was due to her undiagnosed ADHD. Pt inquires about nature and treatment of ADHD. Pt's brother is complying with Adderall XR to manage his ADHD which he tolerates well.      Review of Systems   All other systems reviewed and are negative.      Physical Exam  Vitals signs and nursing note reviewed.   Constitutional:       Appearance: Normal appearance. She is normal weight.   HENT:      Head: Normocephalic and atraumatic.      Right Ear: Tympanic membrane, ear canal and external ear normal.      Left Ear: Tympanic membrane, ear canal and external ear normal.      Nose: Nose normal.      Mouth/Throat:      Mouth: Mucous membranes are dry.      Pharynx: Oropharynx is clear.   Neck:      Musculoskeletal: Normal range of motion and neck supple.   Cardiovascular:      Rate and Rhythm: Normal rate and regular rhythm.      Pulses: Normal pulses.      Heart sounds: Normal heart sounds.   Pulmonary:      Effort: Pulmonary effort is normal.      Breath sounds: Normal breath sounds.   Abdominal:      General: Abdomen is flat.      Palpations: Abdomen is soft.   Musculoskeletal: Normal range of motion.   Skin:     General: Skin is warm and dry.   Neurological:      General: No focal deficit present.      Mental Status: She is alert and oriented to person, place, and time. Mental status is at baseline.   Psychiatric:         Mood and Affect: Mood normal.         Behavior: Behavior normal.         Thought Content: Thought content normal.         Judgment: Judgment normal.         ASSESSMENT and PLAN  Diagnoses and all orders for this visit:    1. Attention deficit hyperactivity disorder (ADHD), other type  Pt underwent  thorough cognitive testing at [facility] and was diagnosed with ADHD. Discussed with pt that I will start her on low dose of Adderall XR and we can determine if we need to increase her dose after next f/u appt in 1 month. Encouraged pt to regularly exercise and avoid caffeine. Will continue to monitor for improvements or changes.  -     amphetamine-dextroamphetamine XR (ADDERALL XR) 10 mg XR capsule; Take 1 Cap by mouth every morning. Max Daily Amount: 10 mg.  Over 50% of the 25 minutes face to face with Su Monks consisted of counseling and/or discussing treatment plans in reference to her ADD and med management .            Lab results and schedule of future lab studies reviewed with patient.  Reviewed diet, exercise and weight control.    Written by Anders Grant, ScribeKick, as dictated by Girtha Hake, MD.  Current diagnosis and concerns discussed with pt at length. Understands risks and benefits or current treatment plan and medications and accepts the treatment and medication with any possible risks. Pt asks appropriate questions which were answered. Pt instructed to call with any concerns or problems.

## 2019-01-14 NOTE — Progress Notes (Signed)
HISTORY OF PRESENT ILLNESS  Latoya Cole is a 43 y.o. female.  HPI  ADHD: Pt followed at [facility] and her overall cognitive functioning was measured. 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 Pt states that when she was in college, she struggled a lot which she believes was due to her undiagnosed ADHD. Pt inquires about nature and treatment of ADHD. Pt's brother is complying with Adderall XR to manage his ADHD which he tolerates well.      Review of Systems   All other systems reviewed and are negative.      Physical Exam  Vitals signs and nursing note reviewed.   Constitutional:       Appearance: Normal appearance. She is normal weight.   HENT:      Head: Normocephalic and atraumatic.      Right Ear: Tympanic membrane, ear canal and external ear normal.      Left Ear: Tympanic membrane, ear canal and external ear normal.      Nose: Nose normal.      Mouth/Throat:      Mouth: Mucous membranes are dry.      Pharynx: Oropharynx is clear.   Neck:      Musculoskeletal: Normal range of motion and neck supple.   Cardiovascular:      Rate and Rhythm: Normal rate and regular rhythm.      Pulses: Normal pulses.      Heart sounds: Normal heart sounds.   Pulmonary:      Effort: Pulmonary effort is normal.      Breath sounds: Normal breath sounds.   Abdominal:      General: Abdomen is flat.      Palpations: Abdomen is soft.   Musculoskeletal: Normal range of motion.   Skin:     General: Skin is warm and dry.   Neurological:      General: No focal deficit present.      Mental Status: She is alert and oriented to person, place, and time. Mental status is at baseline.   Psychiatric:         Mood and Affect: Mood normal.         Behavior: Behavior normal.         Thought Content: Thought content normal.         Judgment: Judgment normal.         ASSESSMENT and PLAN  Diagnoses and all orders for this visit:    1. Attention deficit hyperactivity disorder (ADHD), other type   Pt underwent thorough cognitive testing at [facility] and was diagnosed with ADHD. Discussed with pt that I will start her on low dose of Adderall XR and we can determine if we need to increase her dose after next f/u appt in 1 month. Encouraged pt to regularly exercise and avoid caffeine. Will continue to monitor for improvements or changes.  -     amphetamine-dextroamphetamine XR (ADDERALL XR) 10 mg XR capsule; Take 1 Cap by mouth every morning. Max Daily Amount: 10 mg.  Over 50% of the 25 minutes face to face with Latoya Cole consisted of counseling and/or discussing treatment plans in reference to her ADD and med management .            Lab results and schedule of future lab studies reviewed with patient.  Reviewed diet, exercise and weight control.    Written by Anders Grant, [employer], as dictated by Girtha Hake, MD.  Current diagnosis and concerns discussed with pt at length. Understands risks and benefits or current treatment plan and medications and accepts the treatment and medication with any possible risks. Pt asks appropriate questions which were answered. Pt instructed to call with any concerns or problems.

## 2019-01-16 ENCOUNTER — Encounter

## 2019-01-17 MED ORDER — AMPHETAMINE-DEXTROAMPHETAMINE SR 10 MG 24 HR CAP
10 mg | ORAL_CAPSULE | ORAL | 0 refills | Status: AC
Start: 2019-01-17 — End: ?

## 2019-02-11 ENCOUNTER — Encounter: Attending: Internal Medicine | Primary: Internal Medicine

## 2019-03-03 ENCOUNTER — Encounter

## 2024-04-25 ENCOUNTER — Ambulatory Visit: Admit: 2024-04-25 | Discharge: 2024-04-25 | Payer: BLUE CROSS/BLUE SHIELD | Primary: Internal Medicine

## 2024-04-25 MED ORDER — PREDNISONE 50 MG PO TABS
50 | ORAL_TABLET | Freq: Every day | ORAL | 0 refills | 5.00000 days | Status: AC
Start: 2024-04-25 — End: 2024-04-30

## 2024-04-25 MED ORDER — AZITHROMYCIN 250 MG PO TABS
250 | ORAL_TABLET | ORAL | 0 refills | 5.00000 days | Status: AC
Start: 2024-04-25 — End: ?

## 2024-04-25 NOTE — Progress Notes (Signed)
 "04/25/2024   Pamela Fuentes (DOB: [birth year]) is a 48 y.o. female, New patient, here for evaluation of the following chief complaint(s):  Cough (Sick off/on for a month.  Augmentin x 7d from [employer] for sinusitis.  Denies fever/chills.) and Congestion       ASSESSMENT/PLAN:  Below is the assessment and plan developed based on review of pertinent history, physical exam, labs, studies, and medications.  1. Bronchitis      Orders Placed This Encounter   Medications    azithromycin  (ZITHROMAX ) 250 MG tablet     Sig: 500mg  on day 1 followed by 250mg  on days 2 - 5     Dispense:  6 tablet     Refill:  0    predniSONE  (DELTASONE ) 50 MG tablet     Sig: Take 1 tablet by mouth daily for 5 days     Dispense:  5 tablet     Refill:  0            Handout given with care instructions  Pt with stable VS, non-toxic appearing  Pt in no acute distress and afebrile   4.   OTC for symptom management. Increase fluid intake, ensure adequate nutritional intake.  5.   Follow up with PCP as needed.  6.   Go to ED with development of any acute symptoms.     I have discussed the diagnosis with the patient and the intended plan as seen in the above orders. The patient also understands that early in the process of an illness, the urgent care workup can be falsely reassuring. Routine discharge counseling and specific return precautions dicussed with the patient and the patient understand that worsening, changing, or persistent symptoms should prompt an immediate return to an urgent care or emergency department. Patient/Guardian expressed understanding and agrees with the discharge instructions. No further questions at this time of discharge. The patient has received an after-visit summary and questions were answered concerning future plans. I have discussed medication side effects and warnings with the patient as well. The patient agrees and understands above plan.     Follow up:  Return if symptoms worsen or fail to improve.  Follow up  immediately for any new, worsening or changes or if symptoms are not improving over the next 5-7 days.         SUBJECTIVE/OBJECTIVE:    History provided by:  Patient  Language interpreter used: No           Cough (Sick off/on for a month.  Augmentin x 7d from [employer] for sinusitis.  Denies fever/chills.) and Congestion        Review of Systems   Constitutional:  Negative for chills, fatigue and fever.   HENT:  Positive for congestion, ear pain, rhinorrhea, sinus pressure, sneezing, sore throat and trouble swallowing.    Respiratory:  Positive for cough. Negative for chest tightness, shortness of breath and wheezing.    Cardiovascular: Negative.    Neurological:  Negative for headaches.         Physical Exam  Constitutional:       Appearance: Normal appearance.   HENT:      Head: Normocephalic.      Right Ear: Ear canal and external ear normal. No middle ear effusion.      Left Ear: Ear canal and external ear normal.  No middle ear effusion.      Nose: Mucosal edema, congestion and rhinorrhea present.      Right Turbinates: Not  enlarged.      Left Turbinates: Not enlarged.      Right Sinus: No maxillary sinus tenderness or frontal sinus tenderness.      Left Sinus: No maxillary sinus tenderness or frontal sinus tenderness.      Mouth/Throat:      Mouth: Mucous membranes are moist.      Pharynx: Uvula midline. Posterior oropharyngeal erythema present. No pharyngeal swelling, oropharyngeal exudate or uvula swelling.      Tonsils: No tonsillar exudate or tonsillar abscesses.   Cardiovascular:      Rate and Rhythm: Normal rate and regular rhythm.      Pulses: Normal pulses.      Heart sounds: Normal heart sounds.   Pulmonary:      Effort: Pulmonary effort is normal.      Breath sounds: Rhonchi present. No wheezing or rales.   Lymphadenopathy:      Cervical: No cervical adenopathy.   Neurological:      Mental Status: She is alert.        Vitals:    04/25/24 1346 04/25/24 1350   BP: 132/76 131/79   BP Site: Left Upper Arm  Left Upper Arm   Patient Position: Sitting Sitting   BP Cuff Size: Medium Adult Medium Adult   Pulse: 82    Resp: 16    Temp: 97.4 F (36.3 C)    TempSrc: Oral    SpO2: 97%    Weight: 91.1 kg (200 lb 12.8 oz)    Height: 1.676 m (5' 6)         No Known Allergies    Current Outpatient Medications   Medication Sig Dispense Refill    azithromycin  (ZITHROMAX ) 250 MG tablet 500mg  on day 1 followed by 250mg  on days 2 - 5 6 tablet 0    predniSONE  (DELTASONE ) 50 MG tablet Take 1 tablet by mouth daily for 5 days 5 tablet 0    DYANAVEL XR 15 MG TBCR TAKE 1 TAB BY MOUTH DAILY IN THE MORNING. DO NOT FILL UNTIL 03/27/24.      FLUoxetine (PROZAC) 10 MG capsule        No current facility-administered medications for this visit.        History reviewed. No pertinent past medical history.     Past Surgical History:   Procedure Laterality Date    CHOLECYSTECTOMY      GYN  2015    hysterectomy    GYN      endometriosis and ovary removal    LUMBAR LAMINECTOMY  2016    ORTHOPEDIC SURGERY      9/16 lamincetomy         Social History:   Social Connections: Not on file        Patient Care Team:  Lillis Ports, MD as PCP - General    Patient Active Problem List   Diagnosis    PCOS (polycystic ovarian syndrome)            I ADVISED PATIENT TO GO TO ER IF SYMPTOMS WORSEN , CHANGE OR FAILS TO IMPROVE.    I have discussed the diagnosis with the patient and the intended plan as seen in the above orders.  The patient has received an after-visit summary and questions were answered concerning future plans.  I have discussed medication side effects and warnings with the patient as well. The patient agrees and understands above plan.       An electronic signature was used to authenticate this  note.  -- Rocky Gains, PA-C   "

## 2024-05-12 ENCOUNTER — Emergency Department: Admit: 2024-05-12 | Payer: BLUE CROSS/BLUE SHIELD | Primary: Internal Medicine

## 2024-05-12 ENCOUNTER — Inpatient Hospital Stay
Admit: 2024-05-12 | Discharge: 2024-05-12 | Disposition: A | Discharge status: Home / Self Care | Payer: BLUE CROSS/BLUE SHIELD | Arrived: VH | Attending: Emergency Medicine

## 2024-05-12 LAB — CBC WITH AUTO DIFFERENTIAL
Basophils %: 0.9 % (ref 0.0–1.0)
Basophils Absolute: 0.07 K/UL (ref 0.00–0.10)
Eosinophils %: 1.6 % (ref 0.0–7.0)
Eosinophils Absolute: 0.12 K/UL (ref 0.00–0.40)
Hematocrit: 39.8 % (ref 35.0–47.0)
Hemoglobin: 13.4 g/dL (ref 11.5–16.0)
Immature Granulocytes %: 0.1 % (ref 0–0.5)
Immature Granulocytes Absolute: 0.01 K/UL (ref 0.00–0.04)
Lymphocytes %: 33.3 % (ref 12.0–49.0)
Lymphocytes Absolute: 2.52 K/UL (ref 0.80–3.50)
MCH: 30.7 pg (ref 26.0–34.0)
MCHC: 33.7 g/dL (ref 30.0–36.5)
MCV: 91.1 FL (ref 80.0–99.0)
MPV: 9.3 FL (ref 8.9–12.9)
Monocytes %: 6.9 % (ref 5.0–13.0)
Monocytes Absolute: 0.52 K/UL (ref 0.00–1.00)
Neutrophils %: 57.2 % (ref 32.0–75.0)
Neutrophils Absolute: 4.32 K/UL (ref 1.80–8.00)
Nucleated RBCs: 0 /100{WBCs}
Platelets: 324 K/uL (ref 150–400)
RBC: 4.37 M/uL (ref 3.80–5.20)
RDW: 13.2 % (ref 11.5–14.5)
WBC: 7.6 K/uL (ref 3.6–11.0)
nRBC: 0 K/uL (ref 0.00–0.01)

## 2024-05-12 LAB — TROPONIN: Troponin T: 6.8 ng/L (ref 0–14)

## 2024-05-12 LAB — COMPREHENSIVE METABOLIC PANEL
ALT: 22 U/L (ref 10–35)
AST: 24 U/L (ref 10–35)
Albumin/Globulin Ratio: 1 — ABNORMAL LOW (ref 1.1–2.2)
Albumin: 3.5 g/dL (ref 3.5–5.2)
Alk Phosphatase: 101 U/L (ref 35–104)
Anion Gap: 9 mmol/L (ref 2–14)
BUN/Creatinine Ratio: 18 (ref 12–20)
BUN: 16 mg/dL (ref 6–20)
CO2: 27 mmol/L (ref 20–29)
Calcium: 9.3 mg/dL (ref 8.6–10.0)
Chloride: 104 mmol/L (ref 98–107)
Creatinine: 0.92 mg/dL (ref 0.60–1.00)
Est, Glom Filt Rate: 78 ml/min/1.73m2 (ref >59)
Globulin: 3.6 g/dL (ref 2.0–4.0)
Glucose: 107 mg/dL — ABNORMAL HIGH (ref 65–100)
Potassium: 4 mmol/L (ref 3.5–5.1)
Sodium: 140 mmol/L (ref 136–145)
Total Bilirubin: 0.6 mg/dL (ref 0.0–1.2)
Total Protein: 7.1 g/dL (ref 6.4–8.3)

## 2024-05-12 LAB — D-DIMER, QUANTITATIVE: D-Dimer, Quant: 0.77 mg{FEU}/L — ABNORMAL HIGH (ref 0.00–0.65)

## 2024-05-12 LAB — LIPASE: Lipase: 29 U/L (ref 13–60)

## 2024-05-12 LAB — MAGNESIUM: Magnesium: 2.1 mg/dL (ref 1.6–2.6)

## 2024-05-12 MED ORDER — KETOROLAC TROMETHAMINE 30 MG/ML IJ SOLN
30 | INTRAMUSCULAR | Status: DC
Start: 2024-05-12 — End: 2024-05-12

## 2024-05-12 MED ORDER — IOPAMIDOL 76 % IV SOLN
76 | Freq: Once | INTRAVENOUS | Status: AC | PRN
Start: 2024-05-12 — End: 2024-05-12
  Administered 2024-05-12: 20:00:00 100 mL via INTRAVENOUS

## 2024-05-12 MED FILL — ISOVUE-370 76 % IV SOLN: 76 % | INTRAVENOUS | Qty: 100 | Fill #0

## 2024-05-12 MED FILL — KETOROLAC TROMETHAMINE 30 MG/ML IJ SOLN: 30 mg/mL | INTRAMUSCULAR | Qty: 1 | Fill #0

## 2024-05-12 NOTE — Discharge Instructions (Signed)
"  Please call and schedule a follow-up appointment with your primary care provider after today's visit. You can alternate tylenol and ibuprofen every 3 hours as needed for pain. If you develop any new or worsening symptoms, please return to the ER.  "

## 2024-05-12 NOTE — ED Triage Notes (Signed)
"  Pt ambulated to the treatment area with a steady gait. Pt states in December I had a sinus infection that turned into a Bronchitis and I have had some pain in my chest with cough but then yesterday I sneezed and I felt a pop on my right side lung area and then today I sneezed again and the same this happened.  "

## 2024-05-12 NOTE — ED Provider Notes (Signed)
 [facility] EMERGENCY DEPARTMENT  EMERGENCY DEPARTMENT ENCOUNTER      Pt Name: Pamela Fuentes  MRN: [id number]  Birthdate 1976/02/08  Date of evaluation: 05/12/2024  Provider: Harlene SHAUNNA Riding, PA-C    CHIEF COMPLAINT       Chief Complaint   Patient presents with    Cough    Chest Pain         HISTORY OF PRESENT ILLNESS   (Location/Symptom, Timing/Onset, Context/Setting, Quality, Duration, Modifying Factors, Severity)  Note limiting factors.   49 yo female presenting with complaints of chest pain.  Patient notes that she was diagnosed with bronchitis in December and has had intermittent chest pain with coughing since then.  She notes that yesterday she had pain that was more localized under the right breast and cause significant pain when she sneezed. Noted popping sensation when she sneezed on her right side. She also notes since yesterday she has had persistent mild substernal chest pain.  Denies fever, chills, pain, dyspnea, wheezing, abdominal pain, nausea, vomiting.            Review of External Medical Records:     Nursing Notes were reviewed.    REVIEW OF SYSTEMS    (2-9 systems for level 4, 10 or more for level 5)     Review of Systems   Cardiovascular:  Positive for chest pain.   Musculoskeletal:         R sided rib pain   All other systems reviewed and are negative.      Except as noted above the remainder of the review of systems was reviewed and negative.       PAST MEDICAL HISTORY     Past Medical History:   Diagnosis Date    Endometriosis     PCOS (polycystic ovarian syndrome)          SURGICAL HISTORY       Past Surgical History:   Procedure Laterality Date    CHOLECYSTECTOMY      GYN  2015    hysterectomy    GYN      endometriosis and ovary removal    LUMBAR LAMINECTOMY  2016    ORTHOPEDIC SURGERY      9/16 lamincetomy          CURRENT MEDICATIONS       Discharge Medication List as of 05/12/2024  4:14 PM        CONTINUE these medications which have NOT CHANGED    Details   azithromycin   (ZITHROMAX ) 250 MG tablet 500mg  on day 1 followed by 250mg  on days 2 - 5, Disp-6 tablet, R-0Normal      DYANAVEL XR 15 MG TBCR TAKE 1 TAB BY MOUTH DAILY IN THE MORNING. DO NOT FILL UNTIL 03/27/24., [facility]      FLUoxetine (PROZAC) 10 MG capsule Historical Med             ALLERGIES     Patient has no known allergies.    FAMILY HISTORY     History reviewed. No pertinent family history.       SOCIAL HISTORY       Social History     Socioeconomic History    Marital status: Divorced     Spouse name: None    Number of children: None    Years of education: None    Highest education level: None   Tobacco Use    Smoking status: Never    Smokeless tobacco: Never  Vaping Use    Vaping status: Never Used   Substance and Sexual Activity    Alcohol use: Not Currently    Drug use: No           PHYSICAL EXAM    (up to 7 for level 4, 8 or more for level 5)     ED Triage Vitals   BP Girls Systolic BP Percentile Girls Diastolic BP Percentile Boys Systolic BP Percentile Boys Diastolic BP Percentile Temp Temp Source Pulse   05/12/24 1252 -- -- -- -- 05/12/24 1252 05/12/24 1252 05/12/24 1252   (!) 164/88     97.9 F (36.6 C) Temporal (!) 107      Respirations SpO2 Height Weight - Scale       05/12/24 1252 05/12/24 1253 05/12/24 1252 05/12/24 1252       20 100 % 1.676 m (5' 6) 88.5 kg (195 lb)           Body mass index is 31.47 kg/m.    Physical Exam  Vitals reviewed.   Constitutional:       Appearance: She is not ill-appearing.   HENT:      Head: Normocephalic.      Mouth/Throat:      Mouth: Mucous membranes are moist.   Eyes:      General: No scleral icterus.  Cardiovascular:      Rate and Rhythm: Normal rate and regular rhythm.   Pulmonary:      Effort: Pulmonary effort is normal.      Breath sounds: Normal breath sounds.   Abdominal:      General: There is no distension.      Tenderness: There is no abdominal tenderness.   Musculoskeletal:         General: No deformity.      Cervical back: Normal range of motion.       Comments: Chest pain is not reproducible to palpation   Skin:     General: Skin is warm and dry.   Neurological:      Mental Status: She is alert and oriented to person, place, and time.         DIAGNOSTIC RESULTS     EKG: All EKG's are interpreted by the Emergency Department Physician who either signs or Co-signs this chart in the absence of a cardiologist.        RADIOLOGY:   Non-plain film images such as CT, Ultrasound and MRI are read by the radiologist. Plain radiographic images are visualized and preliminarily interpreted by the emergency physician with the below findings:        Interpretation per the Radiologist below, if available at the time of this note:    CTA CHEST W WO CONTRAST   Final Result   No acute pulmonary embolus. Incidental findings as detailed above.         Electronically signed by Mark Vaughn      XR CHEST (2 VW)   Final Result      No acute process. Pectus excavatum.         Electronically signed by Oneil Collier           LABS:  Labs Reviewed   COMPREHENSIVE METABOLIC PANEL - Abnormal; Notable for the following components:       Result Value    Glucose 107 (*)     Albumin/Globulin Ratio 1.0 (*)     All other components within normal limits   D-DIMER, QUANTITATIVE - Abnormal; Notable  for the following components:    D-Dimer, Quant 0.77 (*)     All other components within normal limits   CBC WITH AUTO DIFFERENTIAL   MAGNESIUM   TROPONIN   LIPASE   EXTRA TUBES HOLD       All other labs were within normal range or not returned as of this dictation.    EMERGENCY DEPARTMENT COURSE and DIFFERENTIAL DIAGNOSIS/MDM:   Vitals:    Vitals:    05/12/24 1430 05/12/24 1445 05/12/24 1500 05/12/24 1515   BP: (!) 151/70 (!) 158/63 (!) 156/72 (!) 149/62   Pulse:       Resp:       Temp:       TempSrc:       SpO2: 99%  100% 100%   Weight:       Height:               Medical Decision Making  Patient presents with Chest pain and right sided rib pain. DDx includes ACS, PNA, PE, PTX, pericarditis, myocarditis, GERD,  costochondritis, anxiety, PE, rib fracture, costochondritis.  Will obtain EKG and troponin to evaluate for evidence of ACS.  EKG interpreted by attending shows normal sinus rhythm.  Troponin is unremarkable at this time.  Will obtain CBC, CMP to evaluate for infection, anemia, renal/hepatic injury or electrolyte disturbance at this time.  CBC and CMP are unremarkable at this time.  Ordered D-dimer to assess for possible PE. Ordered CXR to evaluate for CHF, PTX,  PNA.  Chest x-ray unremarkable at this time for acute abnormality.  CTA of the chest ordered due to d-dimer elevation did not demonstrate pulmonary embolism or other acute abnormality.  Incidental findings on CT scan discussed with the patient.  At this time, suspicion is highest for musculoskeletal etiology of the patient's pain.  Recommended alternating Tylenol and ibuprofen as needed for pain.  Recommended patient follow-up with her primary care provider.  Advised patient to return to the ER if develops any new or worsening symptoms.    Amount and/or Complexity of Data Reviewed  Labs: ordered.  Radiology: ordered.  ECG/medicine tests: ordered.    Risk  Prescription drug management.            REASSESSMENT     ED Course as of 05/12/24 1623   Mon May 12, 2024   1337 EKG 12 Lead (Chest Pain)  ED ECG interpretation:  Rhythm: normal sinus rhythm. Rate (approx.): 80.  Axis: normal.  ST segment:  No concerning ST elevations or depressions. This EKG was independently interpreted by Reyes ONEIDA Rocks, MD,ED Provider.   [JM]      ED Course User Index  [JM] Rocks Reyes ONEIDA, MD           CONSULTS:  None    PROCEDURES:  Unless otherwise noted below, none     Procedures      FINAL IMPRESSION      1. Rib pain on right side          DISPOSITION/PLAN   DISPOSITION Decision To Discharge 05/12/2024 04:12:59 PM      PATIENT REFERRED TO:  Lillis Ports, MD  [address] [place]  [address]  [place] [address]  [phone]    Schedule an appointment as soon as  possible for a visit       [facility] Emergency Department  [address] [place] [address]  [place]  [address]  [phone]    As needed, If symptoms worsen  DISCHARGE MEDICATIONS:  Discharge Medication List as of 05/12/2024  4:14 PM            (Please note that portions of this note were completed with a voice recognition program.  Efforts were made to edit the dictations but occasionally words are mis-transcribed.)    Harlene SHAUNNA Riding, PA-C (electronically signed)  Emergency Attending Physician / Physician Assistant / Nurse Practitioner             Riding Harlene SHAUNNA, PA-C  05/12/24 1623

## 2024-05-13 LAB — EKG 12-LEAD
Atrial Rate: 80 {beats}/min
Diagnosis: NORMAL
P Axis: 54 degrees
P-R Interval: 152 ms
Q-T Interval: 372 ms
QRS Duration: 82 ms
QTc Calculation (Bazett): 429 ms
R Axis: 6 degrees
T Axis: 30 degrees
Ventricular Rate: 80 {beats}/min
